# Patient Record
Sex: Female | Born: 1995
Health system: Southern US, Community
[De-identification: ages and names within clinical notes are randomized; demographics above are authoritative.]

## PROBLEM LIST (undated history)

## (undated) DIAGNOSIS — N920 Excessive and frequent menstruation with regular cycle: Secondary | ICD-10-CM

## (undated) DIAGNOSIS — N946 Dysmenorrhea, unspecified: Secondary | ICD-10-CM

## (undated) DIAGNOSIS — R1110 Vomiting, unspecified: Secondary | ICD-10-CM

## (undated) DIAGNOSIS — Z8614 Personal history of Methicillin resistant Staphylococcus aureus infection: Secondary | ICD-10-CM

## (undated) DIAGNOSIS — R110 Nausea: Secondary | ICD-10-CM

## (undated) DIAGNOSIS — R109 Unspecified abdominal pain: Secondary | ICD-10-CM

## (undated) DIAGNOSIS — R51 Headache: Secondary | ICD-10-CM

## (undated) DIAGNOSIS — F419 Anxiety disorder, unspecified: Secondary | ICD-10-CM

## (undated) HISTORY — DX: Vomiting, unspecified: R11.10

## (undated) HISTORY — DX: Unspecified abdominal pain: R10.9

## (undated) HISTORY — DX: Dysmenorrhea, unspecified: N94.6

## (undated) HISTORY — DX: Nausea: R11.0

## (undated) HISTORY — DX: Excessive and frequent menstruation with regular cycle: N92.0

---
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
## 2015-05-08 DIAGNOSIS — Z3202 Encounter for pregnancy test, result negative: Secondary | ICD-10-CM | POA: Diagnosis not present

## 2015-05-08 DIAGNOSIS — Z79899 Other long term (current) drug therapy: Secondary | ICD-10-CM | POA: Diagnosis not present

## 2015-05-08 DIAGNOSIS — R102 Pelvic and perineal pain: Secondary | ICD-10-CM | POA: Insufficient documentation

## 2015-05-08 DIAGNOSIS — R112 Nausea with vomiting, unspecified: Secondary | ICD-10-CM | POA: Insufficient documentation

## 2015-05-08 LAB — CBC WITH DIFFERENTIAL/PLATELET
BASOS ABS: 0 10*3/uL (ref 0–0.1)
Basophils Relative: 0 %
EOS ABS: 0.1 10*3/uL (ref 0–0.7)
Eosinophils Relative: 1 %
HCT: 45.2 % (ref 35.0–47.0)
HEMOGLOBIN: 15.4 g/dL (ref 12.0–16.0)
LYMPHS ABS: 2.9 10*3/uL (ref 1.0–3.6)
Lymphocytes Relative: 22 %
MCH: 30.1 pg (ref 26.0–34.0)
MCHC: 34 g/dL (ref 32.0–36.0)
MCV: 88.6 fL (ref 80.0–100.0)
Monocytes Absolute: 0.9 10*3/uL (ref 0.2–0.9)
Monocytes Relative: 7 %
NEUTROS PCT: 70 %
Neutro Abs: 9.1 10*3/uL — ABNORMAL HIGH (ref 1.4–6.5)
Platelets: 219 10*3/uL (ref 150–440)
RBC: 5.1 MIL/uL (ref 3.80–5.20)
RDW: 12.3 % (ref 11.5–14.5)
WBC: 13.1 10*3/uL — AB (ref 3.6–11.0)

## 2015-05-08 LAB — COMPREHENSIVE METABOLIC PANEL
ALBUMIN: 4.6 g/dL (ref 3.5–5.0)
ALK PHOS: 89 U/L (ref 38–126)
ALT: 19 U/L (ref 14–54)
AST: 21 U/L (ref 15–41)
Anion gap: 10 (ref 5–15)
BUN: 20 mg/dL (ref 6–20)
CALCIUM: 9.6 mg/dL (ref 8.9–10.3)
CO2: 24 mmol/L (ref 22–32)
CREATININE: 0.77 mg/dL (ref 0.44–1.00)
Chloride: 103 mmol/L (ref 101–111)
GFR calc non Af Amer: >60 mL/min (ref >60)
GLUCOSE: 91 mg/dL (ref 65–99)
Potassium: 3.5 mmol/L (ref 3.5–5.1)
SODIUM: 137 mmol/L (ref 135–145)
Total Bilirubin: 0.6 mg/dL (ref 0.3–1.2)
Total Protein: 8.6 g/dL — ABNORMAL HIGH (ref 6.5–8.1)

## 2015-05-08 NOTE — ED Notes (Signed)
Pt in with co lower abd pain and nausea.  Vomited x 1 today, no diarrhea.

## 2015-05-09 ENCOUNTER — Emergency Department
Admission: EM | Admit: 2015-05-09 | Discharge: 2015-05-09 | Disposition: A | Discharge status: Home / Self Care | Payer: BLUE CROSS/BLUE SHIELD | Attending: Emergency Medicine | Admitting: Emergency Medicine

## 2015-05-09 DIAGNOSIS — R110 Nausea: Secondary | ICD-10-CM

## 2015-05-09 LAB — URINALYSIS COMPLETE WITH MICROSCOPIC (ARMC ONLY)
BACTERIA UA: NONE SEEN
Bilirubin Urine: NEGATIVE
GLUCOSE, UA: NEGATIVE mg/dL
HGB URINE DIPSTICK: NEGATIVE
Ketones, ur: NEGATIVE mg/dL
LEUKOCYTES UA: NEGATIVE
Nitrite: NEGATIVE
PH: 6 (ref 5.0–8.0)
PROTEIN: NEGATIVE mg/dL
RBC / HPF: NONE SEEN RBC/hpf (ref 0–5)
Specific Gravity, Urine: 1.021 (ref 1.005–1.030)

## 2015-05-09 LAB — PREGNANCY, URINE: Preg Test, Ur: NEGATIVE

## 2015-05-09 LAB — WET PREP, GENITAL: Trich, Wet Prep: NEGATIVE — AB

## 2015-05-09 LAB — CHLAMYDIA/NGC RT PCR (ARMC ONLY)
Chlamydia Tr: NOT DETECTED
N GONORRHOEAE: NOT DETECTED

## 2015-05-09 LAB — HCG, QUANTITATIVE, PREGNANCY

## 2015-05-09 NOTE — ED Notes (Signed)
MD at bedside. 

## 2015-05-09 NOTE — Discharge Instructions (Signed)
Nausea, Adult °Nausea is the feeling that you have an upset stomach or have to vomit. Nausea by itself is not likely a serious concern, but it may be an early sign of more serious medical problems. As nausea gets worse, it can lead to vomiting. If vomiting develops, there is the risk of dehydration.  °CAUSES  °· Viral infections. °· Food poisoning. °· Medicines. °· Pregnancy. °· Motion sickness. °· Migraine headaches. °· Emotional distress. °· Severe pain from any source. °· Alcohol intoxication. °HOME CARE INSTRUCTIONS °· Get plenty of rest. °· Ask your caregiver about specific rehydration instructions. °· Eat small amounts of food and sip liquids more often. °· Take all medicines as told by your caregiver. °SEEK MEDICAL CARE IF: °· You have not improved after 2 days, or you get worse. °· You have a headache. °SEEK IMMEDIATE MEDICAL CARE IF:  °· You have a fever. °· You faint. °· You keep vomiting or have blood in your vomit. °· You are extremely weak or dehydrated. °· You have dark or bloody stools. °· You have severe chest or abdominal pain. °MAKE SURE YOU: °· Understand these instructions. °· Will watch your condition. °· Will get help right away if you are not doing well or get worse. °  °This information is not intended to replace advice given to you by your health care provider. Make sure you discuss any questions you have with your health care provider. °  °Document Released: 07/01/2004 Document Revised: 06/14/2014 Document Reviewed: 02/03/2011 °Elsevier Interactive Patient Education ©2016 Elsevier Inc. ° °

## 2015-05-09 NOTE — ED Provider Notes (Signed)
[facility] Emergency Department Provider Note  ____________________________________________  Time seen: 1:00 AM  I have reviewed the triage vital signs and the nursing notes.   HISTORY  Chief Complaint Nausea      HPI Kathy Pham is a 19 y.o. female presents with lower pelvic pain and nausea times one week with one episode of nonbloody emesis today. Patient denies any fever no diarrhea. Last menstrual period October.   Past Medical History  Diagnosis Date  . Abdominal pain, recurrent   . Vomiting   . Nausea   . OZHYQMVH(846.9)     Patient Active Problem List   Diagnosis Date Noted  . Episodic tension type headache 04/06/2013  . Anxiety state, unspecified 01/25/2013  . Depression 01/24/2013  . Migraine without aura, without mention of intractable migraine without mention of status migrainosus 01/24/2013  . Generalized abdominal pain   . Vomiting   . Nausea     Past Surgical History  Procedure Laterality Date  . Esophagogastroduodenoscopy  08/20/2011    Procedure: ESOPHAGOGASTRODUODENOSCOPY (EGD);  Surgeon: Jon Gills, MD;  Location: [facility] OR;  Service: Gastroenterology;  Laterality: N/A;    Current Outpatient Rx  Name  Route  Sig  Dispense  Refill  . FLUoxetine (PROZAC) 20 MG capsule   Oral   Take 20 mg by mouth daily.         Marland Kitchen ibuprofen (ADVIL,MOTRIN) 200 MG tablet   Oral   Take 400 mg by mouth every 6 (six) hours as needed. For pain         . omeprazole (PRILOSEC) 20 MG capsule   Oral   Take 20 mg by mouth daily.         . ondansetron (ZOFRAN-ODT) 8 MG disintegrating tablet   Oral   Take 8 mg by mouth 3 (three) times daily as needed. For nausea         . promethazine (PHENERGAN) 25 MG tablet   Oral   Take 25 mg by mouth every 6 (six) hours as needed. For nausea         . SUMAtriptan (IMITREX) 50 MG tablet      TAKE ONE TABLET BY MOUTH WITH TWO TABLETS OF A NON-STEROIDAL ANTI-INFLAMMATORY MEDICATION AT THE  ONSET OF A MIGRAINE   6 tablet   5   . verapamil (CALAN) 40 MG tablet      Take 1 tablet at bedtime   30 tablet   3     Allergies Review of patient's allergies indicates no known allergies.  Family History  Problem Relation Age of Onset  . GER disease Father     Social History Social History  Substance Use Topics  . Smoking status: Never Smoker   . Smokeless tobacco: Never Used  . Alcohol Use: No    Review of Systems  Constitutional: Negative for fever. Eyes: Negative for visual changes. ENT: Negative for sore throat. Cardiovascular: Negative for chest pain. Respiratory: Negative for shortness of breath. Gastrointestinal: Negative for abdominal pain, vomiting and diarrhea. Genitourinary: Negative for dysuria. Musculoskeletal: Negative for back pain. Skin: Negative for rash. Neurological: Negative for headaches, focal weakness or numbness.   10-point ROS otherwise negative.  ____________________________________________   PHYSICAL EXAM:  VITAL SIGNS: ED Triage Vitals  Enc Vitals Group     BP 05/08/15 2312 123/79 mmHg     Pulse Rate 05/08/15 2312 112     Resp 05/08/15 2312 18     Temp 05/08/15 2312 98.3 F (36.8  C)     Temp Source 05/08/15 2312 Oral     SpO2 05/08/15 2312 98 %     Weight 05/08/15 2312 135 lb (61.236 kg)     Height 05/08/15 2312 5\' 3"  (1.6 m)     Head Cir --      Peak Flow --      Pain Score --      Pain Loc --      Pain Edu? --      Excl. in GC? --     Constitutional: Alert and oriented. Well appearing and in no distress. Eyes: Conjunctivae are normal. PERRL. Normal extraocular movements. ENT   Head: Normocephalic and atraumatic.   Nose: No congestion/rhinnorhea.   Mouth/Throat: Mucous membranes are moist.   Neck: No stridor. Hematological/Lymphatic/Immunilogical: No cervical lymphadenopathy. Cardiovascular: Normal rate, regular rhythm. Normal and symmetric distal pulses are present in all extremities. No murmurs,  rubs, or gallops. Respiratory: Normal respiratory effort without tachypnea nor retractions. Breath sounds are clear and equal bilaterally. No wheezes/rales/rhonchi. Gastrointestinal: Soft and nontender. No distention. There is no CVA tenderness. Genitourinary: deferred Musculoskeletal: Nontender with normal range of motion in all extremities. No joint effusions.  No lower extremity tenderness nor edema. Neurologic:  Normal speech and language. No gross focal neurologic deficits are appreciated. Speech is normal.  Skin:  Skin is warm, dry and intact. No rash noted. Psychiatric: Mood and affect are normal. Speech and behavior are normal. Patient exhibits appropriate insight and judgment.  ____________________________________________    LABS (pertinent positives/negatives)  Labs Reviewed  WET PREP, GENITAL - Abnormal; Notable for the following:    Yeast Wet Prep HPF POC NONE (*)    Trich, Wet Prep NEGATIVE (*)    Clue Cells Wet Prep HPF POC NONE (*)    WBC, Wet Prep HPF POC FEW (*)    All other components within normal limits  CBC WITH DIFFERENTIAL/PLATELET - Abnormal; Notable for the following:    WBC 13.1 (*)    Neutro Abs 9.1 (*)    All other components within normal limits  COMPREHENSIVE METABOLIC PANEL - Abnormal; Notable for the following:    Total Protein 8.6 (*)    All other components within normal limits  URINALYSIS COMPLETEWITH MICROSCOPIC ([facility] ONLY) - Abnormal; Notable for the following:    Color, Urine YELLOW (*)    APPearance CLEAR (*)    Squamous Epithelial / LPF 0-5 (*)    All other components within normal limits  CHLAMYDIA/NGC RT PCR ([facility] ONLY)  PREGNANCY, URINE  HCG, QUANTITATIVE, PREGNANCY           INITIAL IMPRESSION / ASSESSMENT AND PLAN / ED COURSE  Pertinent labs & imaging results that were available during my care of the patient were reviewed by me and considered in my medical decision making (see chart for details).  Patient states  that she was very concerned about possibly pregnant however hCG Quant less than 1.  ____________________________________________   FINAL CLINICAL IMPRESSION(S) / ED DIAGNOSES  Final diagnoses:  Nausea      Darci Currentandolph N Modesty Rudy, MD 05/09/15 [phone]

## 2016-01-08 ENCOUNTER — Ambulatory Visit (INDEPENDENT_AMBULATORY_CARE_PROVIDER_SITE_OTHER): Payer: BLUE CROSS/BLUE SHIELD | Admitting: Obstetrics and Gynecology

## 2016-01-08 ENCOUNTER — Encounter: Payer: Self-pay | Admitting: Obstetrics and Gynecology

## 2016-01-08 VITALS — BP 119/78 | HR 89 | Ht 62.5 in | Wt 165.0 lb

## 2016-01-08 DIAGNOSIS — N926 Irregular menstruation, unspecified: Secondary | ICD-10-CM

## 2016-01-08 DIAGNOSIS — Z308 Encounter for other contraceptive management: Secondary | ICD-10-CM

## 2016-01-08 MED ORDER — LEVONORGESTREL-ETHINYL ESTRAD 0.1-20 MG-MCG PO TABS: 1.0000 | ORAL_TABLET | Freq: Every day | ORAL | 11 refills | Status: DC

## 2016-01-10 DIAGNOSIS — N926 Irregular menstruation, unspecified: Secondary | ICD-10-CM | POA: Insufficient documentation

## 2016-01-10 NOTE — Progress Notes (Signed)
    GYNECOLOGY PROGRESS NOTE  Subjective:    Patient ID: Kathy Pham, female    DOB: 03/26/1996, 20 y.o.   MRN: [id number]  HPI  Patient is a 20 y.o. G0P0 female who presents for complaints of irregular menses.  Was previously on combined OCPs which helped to regulate cycles, however patient notes that she lost her prescription and was unable to get medication filled.  Reports last cycle was heavy, however prior to this had not had a cycle in approximately 2-3 months.  The following portions of the patient's history were reviewed and updated as appropriate: allergies, current medications, past family history, past medical history, past social history, past surgical history and problem list.  Review of Systems Pertinent items noted in HPI and remainder of comprehensive ROS otherwise negative.   Objective:   Blood pressure 119/78, pulse 89, height 5' 2.5" (1.588 m), weight 165 lb (74.8 kg), last menstrual period 01/03/2016. General appearance: alert and no distress Exam deferred  Assessment:   Irregular menses Contraception management  Plan:   Refilled OCP prescription. Patient to begin new OCPs on Sunday. To follow up as needed.  Hildred Laser, MD [facility]

## 2017-10-26 ENCOUNTER — Ambulatory Visit (INDEPENDENT_AMBULATORY_CARE_PROVIDER_SITE_OTHER): Payer: BLUE CROSS/BLUE SHIELD | Admitting: Obstetrics and Gynecology

## 2017-10-26 ENCOUNTER — Telehealth: Payer: Self-pay | Admitting: Obstetrics and Gynecology

## 2017-10-26 ENCOUNTER — Encounter: Payer: Self-pay | Admitting: Obstetrics and Gynecology

## 2017-10-26 ENCOUNTER — Ambulatory Visit (INDEPENDENT_AMBULATORY_CARE_PROVIDER_SITE_OTHER): Payer: BLUE CROSS/BLUE SHIELD

## 2017-10-26 VITALS — BP 110/71 | HR 83 | Ht 62.5 in | Wt 174.8 lb

## 2017-10-26 DIAGNOSIS — Z3687 Encounter for antenatal screening for uncertain dates: Secondary | ICD-10-CM

## 2017-10-26 DIAGNOSIS — O039 Complete or unspecified spontaneous abortion without complication: Secondary | ICD-10-CM

## 2017-10-26 DIAGNOSIS — N912 Amenorrhea, unspecified: Secondary | ICD-10-CM

## 2017-10-26 LAB — POCT URINE PREGNANCY: Preg Test, Ur: POSITIVE — AB

## 2017-10-26 MED ORDER — DOXYLAMINE-PYRIDOXINE 10-10 MG PO TBEC: 10.0000 mg | DELAYED_RELEASE_TABLET | Freq: Every day | ORAL | 1 refills | Status: DC

## 2017-10-26 NOTE — Addendum Note (Signed)
Addended by: Elonda Husky on: 10/26/2017 04:18 PM   Modules accepted: Level of Service

## 2017-10-26 NOTE — Progress Notes (Addendum)
HPI:      [name] is a 22 y.o. G1P0000 who LMP was Patient's last menstrual period was 09/15/2017 (approximate).  Subjective:   She presents today after missing her menstrual period and having a positive pregnancy test.  She was not attempting pregnancy and has some misgivings about being pregnant. She has experienced some nausea and vomiting. She reports that she has had a small amount of spotting over 3 days. She is not taking prenatal vitamins    Hx: The following portions of the patient's history were reviewed and updated as appropriate:             She  has a past medical history of Abdominal pain, recurrent, Dysmenorrhea, Headache(784.0), Menorrhagia, Nausea, and Vomiting. She does not have any pertinent problems on file. She  has a past surgical history that includes Esophagogastroduodenoscopy (08/20/2011). Her family history includes Diabetes in her maternal grandfather and paternal grandmother; GER disease in her father. She  reports that she has been smoking cigarettes.  She has been smoking about 0.25 packs per day. She has never used smokeless tobacco. She reports that she does not drink alcohol or use drugs. She has a current medication list which includes the following prescription(s): doxylamine-pyridoxine. She has No Known Allergies.       Review of Systems:  Review of Systems  Constitutional: Denied constitutional symptoms, night sweats, recent illness, fatigue, fever, insomnia and weight loss.  Eyes: Denied eye symptoms, eye pain, photophobia, vision change and visual disturbance.  Ears/Nose/Throat/Neck: Denied ear, nose, throat or neck symptoms, hearing loss, nasal discharge, sinus congestion and sore throat.  Cardiovascular: Denied cardiovascular symptoms, arrhythmia, chest pain/pressure, edema, exercise intolerance, orthopnea and palpitations.  Respiratory: Denied pulmonary symptoms, asthma, pleuritic pain, productive sputum, cough, dyspnea and wheezing.   Gastrointestinal: Denied, gastro-esophageal reflux, melena, nausea and vomiting.  Genitourinary: Denied genitourinary symptoms including symptomatic vaginal discharge, pelvic relaxation issues, and urinary complaints.  Musculoskeletal: Denied musculoskeletal symptoms, stiffness, swelling, muscle weakness and myalgia.  Dermatologic: Denied dermatology symptoms, rash and scar.  Neurologic: Denied neurology symptoms, dizziness, headache, neck pain and syncope.  Psychiatric: Denied psychiatric symptoms, anxiety and depression.  Endocrine: Denied endocrine symptoms including hot flashes and night sweats.   Meds:   No current outpatient medications on file prior to visit.   No current facility-administered medications on file prior to visit.     Objective:     Vitals:   10/26/17 0819  BP: 110/71  Pulse: 83                Assessment:    G1P0000 Patient Active Problem List   Diagnosis Date Noted  . Irregular menses 01/10/2016  . Episodic tension type headache 04/06/2013  . Anxiety state, unspecified 01/25/2013  . Depression 01/24/2013  . Migraine without aura, without mention of intractable migraine without mention of status migrainosus 01/24/2013  . Generalized abdominal pain   . Vomiting   . Nausea      1. Amenorrhea   2. Unsure of LMP (last menstrual period) as reason for ultrasound scan     Positive pregnancy test   Plan:            1.  Ultrasound to determine dating and to investigate spotting.  2.  Diclegis for nausea and vomiting   Discussed dietary modification in detail other strategies for nausea and vomiting discussed  3.  Nurse visit  4.  New OB visit in 6 weeks.   5.  Discussed genetic testing  including turning 21 and quad screen.  Genetic testing and testing for other inheritable conditions discussed in detail. She will decide in the future whether to have these labs performed.    6.  Spotting in early pregnancy discussed     7.  A general overview of  pregnancy testing, visit schedule, ultrasound schedule, and prenatal care was discussed.  Orders Orders Placed This Encounter  Procedures  . US OB Comp Less 14 Wks  . US OB Transvaginal  . POCT urine pregnancy     Meds ordered this encounter  Medications  . Doxylamine-Pyridoxine 10-10 MG TBEC    Sig: Take 10 mg by mouth daily. 2 at bedtime, 1 mid morning, 1 mid afternoon- no more than 4 qd    Dispense:  60 tablet    Refill:  1      F/U  No follow-ups on file. I spent 27 minutes involved in the care of this patient of which greater than 50% was spent discussing nausea and vomiting of pregnancy, spotting in pregnancy, general overview of pregnancy testing visit schedules ultrasounds, genetic testing, use of prenatal vitamins, general pregnancy questions.  Elonda Husky, M.D. 10/26/2017 8:48 AM        Blood type given as B+. Addendum: Patient called complaining of significantly worsened bleeding and cramping and an ultrasound was ordered. At the time of ultrasound Significant amount of vaginal bleeding was found with nothing found in the uterus or adnexa consistent with pregnancy.  Assessment: Likely SAB Plan: SAb I have discussed the possibility of miscarriage with the patient.  I have informed her that vaginal bleeding, cramping or passage of tissue are the most common signs of miscarriage.  Should she have heavy bleeding, pass tissue or have other problems, she has been informed to call the office immediately.  I have advised her to remain at pelvic rest for at least one week after her last episode of bleeding.  If she is currently working, I have instructed her to discontinue until this situation resolves.  Complete versus incomplete miscarriage was discussed and the patient is aware that should she develop heavy bleeding, fever, or persistent crampy pelvic pain she may require a D & E to remove the remaining portion of the miscarried pregnancy.  I advised her to keep me informed  should her condition change. I have discussed the possible use of Cytotec and she has declined this at this time. Advised follow-up in 1 week or sooner if symptoms do not resolve.  Plan quantitative beta hCG next week and then weekly until 0.  Patient would be interested in birth control at that time.  I spent 18 minutes involved in the care of this patient of which greater than 50% was spent discussing spontaneous miscarriage, management of miscarriage, future testing including beta hCGs, future birth control.

## 2017-11-02 ENCOUNTER — Encounter: Payer: Self-pay | Admitting: Obstetrics and Gynecology

## 2017-11-02 ENCOUNTER — Ambulatory Visit (INDEPENDENT_AMBULATORY_CARE_PROVIDER_SITE_OTHER): Payer: BLUE CROSS/BLUE SHIELD | Admitting: Obstetrics and Gynecology

## 2017-11-02 VITALS — BP 110/67 | HR 87 | Ht 63.0 in | Wt 175.3 lb

## 2017-11-02 DIAGNOSIS — O039 Complete or unspecified spontaneous abortion without complication: Secondary | ICD-10-CM

## 2017-11-02 DIAGNOSIS — Z3009 Encounter for other general counseling and advice on contraception: Secondary | ICD-10-CM | POA: Diagnosis not present

## 2017-11-02 NOTE — Progress Notes (Signed)
Pt is present today following a miscarriage. No other concerns.

## 2017-11-02 NOTE — Progress Notes (Signed)
HPI:      [name] is a 22 y.o. G1P0000 who LMP was Patient's last menstrual period was 09/15/2017 (approximate).  Subjective:   She presents today after having a miscarriage.  She reports her bleeding has resolved after significant cramping and passing blood clots.  She is interested in birth control when her beta-hCG goes to 0.  She would like to discuss birth control today.    Hx: The following portions of the patient's history were reviewed and updated as appropriate:             She  has a past medical history of Abdominal pain, recurrent, Dysmenorrhea, Headache(784.0), Menorrhagia, Nausea, and Vomiting. She does not have any pertinent problems on file. She  has a past surgical history that includes Esophagogastroduodenoscopy (08/20/2011). Her family history includes Diabetes in her maternal grandfather and paternal grandmother; GER disease in her father. She  reports that she has been smoking cigarettes.  She has been smoking about 0.25 packs per day. She has never used smokeless tobacco. She reports that she does not drink alcohol or use drugs. She currently has no medications in their medication list. She has No Known Allergies.       Review of Systems:  Review of Systems  Constitutional: Denied constitutional symptoms, night sweats, recent illness, fatigue, fever, insomnia and weight loss.  Eyes: Denied eye symptoms, eye pain, photophobia, vision change and visual disturbance.  Ears/Nose/Throat/Neck: Denied ear, nose, throat or neck symptoms, hearing loss, nasal discharge, sinus congestion and sore throat.  Cardiovascular: Denied cardiovascular symptoms, arrhythmia, chest pain/pressure, edema, exercise intolerance, orthopnea and palpitations.  Respiratory: Denied pulmonary symptoms, asthma, pleuritic pain, productive sputum, cough, dyspnea and wheezing.  Gastrointestinal: Denied, gastro-esophageal reflux, melena, nausea and vomiting.  Genitourinary: Denied genitourinary  symptoms including symptomatic vaginal discharge, pelvic relaxation issues, and urinary complaints.  Musculoskeletal: Denied musculoskeletal symptoms, stiffness, swelling, muscle weakness and myalgia.  Dermatologic: Denied dermatology symptoms, rash and scar.  Neurologic: Denied neurology symptoms, dizziness, headache, neck pain and syncope.  Psychiatric: Denied psychiatric symptoms, anxiety and depression.  Endocrine: Denied endocrine symptoms including hot flashes and night sweats.   Meds:   No current outpatient medications on file prior to visit.   No current facility-administered medications on file prior to visit.     Objective:     Vitals:   11/02/17 1052  BP: 110/67  Pulse: 87                Assessment:    G1P0000 Patient Active Problem List   Diagnosis Date Noted  . Irregular menses 01/10/2016  . Episodic tension type headache 04/06/2013  . Anxiety state, unspecified 01/25/2013  . Depression 01/24/2013  . Migraine without aura, without mention of intractable migraine without mention of status migrainosus 01/24/2013  . Generalized abdominal pain   . Vomiting   . Nausea      1. SAB (spontaneous abortion)   2. Birth control counseling     Discussed SAb in detail.  Questions answered.  Future pregnancy discussed.   Plan:            1.  Plan to follow hCG until 0.  2.  Birth Control I discussed multiple birth control options and methods with the patient.  The risks and benefits of each were reviewed. IUD Literature on Mirena given.  Risks and benefits discussed.  She is considering IUD as an option for birth/cycle control. She will inform [place] when she is reached a decision regarding  birth control and after her hCG is 0 we will follow-up for this. Orders Orders Placed This Encounter  Procedures  . Beta hCG quant (ref lab)  . Beta hCG quant (ref lab)    No orders of the defined types were placed in this encounter.     F/U  Return in about 1 month  (around 11/30/2017). I spent 17 minutes involved in the care of this patient of which greater than 50% was spent discussing miscarriage, risks for future pregnancies, timing of next pregnancy, multiple birth control methods.  Elonda Husky, M.D. 11/02/2017 11:32 AM

## 2017-11-03 LAB — BETA HCG QUANT (REF LAB): HCG QUANT: 3 m[IU]/mL

## 2017-11-09 ENCOUNTER — Other Ambulatory Visit: Payer: BLUE CROSS/BLUE SHIELD

## 2017-11-17 NOTE — Telephone Encounter (Signed)
error 

## 2017-11-30 ENCOUNTER — Encounter: Payer: BLUE CROSS/BLUE SHIELD | Admitting: Obstetrics and Gynecology

## 2017-12-06 ENCOUNTER — Ambulatory Visit (INDEPENDENT_AMBULATORY_CARE_PROVIDER_SITE_OTHER): Payer: BLUE CROSS/BLUE SHIELD | Admitting: Obstetrics and Gynecology

## 2017-12-06 ENCOUNTER — Encounter: Payer: Self-pay | Admitting: Obstetrics and Gynecology

## 2017-12-06 VITALS — BP 120/72 | HR 96 | Ht 63.0 in | Wt 174.1 lb

## 2017-12-06 DIAGNOSIS — Z30430 Encounter for insertion of intrauterine contraceptive device: Secondary | ICD-10-CM | POA: Diagnosis not present

## 2017-12-06 NOTE — Progress Notes (Signed)
Pt is present today for iud insertion. Pt stated that she is doing well.

## 2017-12-06 NOTE — Progress Notes (Signed)
HPI:      [name] is a 22 y.o. G1P0000 who LMP was Patient's last menstrual period was 12/04/2017.  Subjective:   She presents today for IUD insertion.  She has decided upon IUD for birth control.  She is currently on her menstrual period.    Hx: The following portions of the patient's history were reviewed and updated as appropriate:             She  has a past medical history of Abdominal pain, recurrent, Dysmenorrhea, Headache(784.0), Menorrhagia, Nausea, and Vomiting. She does not have any pertinent problems on file. She  has a past surgical history that includes Esophagogastroduodenoscopy (08/20/2011). Her family history includes Diabetes in her maternal grandfather and paternal grandmother; GER disease in her father. She  reports that she has been smoking cigarettes.  She has been smoking about 0.25 packs per day. She has never used smokeless tobacco. She reports that she does not drink alcohol or use drugs. She currently has no medications in their medication list. She has No Known Allergies.       Review of Systems:  Review of Systems  Constitutional: Denied constitutional symptoms, night sweats, recent illness, fatigue, fever, insomnia and weight loss.  Eyes: Denied eye symptoms, eye pain, photophobia, vision change and visual disturbance.  Ears/Nose/Throat/Neck: Denied ear, nose, throat or neck symptoms, hearing loss, nasal discharge, sinus congestion and sore throat.  Cardiovascular: Denied cardiovascular symptoms, arrhythmia, chest pain/pressure, edema, exercise intolerance, orthopnea and palpitations.  Respiratory: Denied pulmonary symptoms, asthma, pleuritic pain, productive sputum, cough, dyspnea and wheezing.  Gastrointestinal: Denied, gastro-esophageal reflux, melena, nausea and vomiting.  Genitourinary: Denied genitourinary symptoms including symptomatic vaginal discharge, pelvic relaxation issues, and urinary complaints.  Musculoskeletal: Denied  musculoskeletal symptoms, stiffness, swelling, muscle weakness and myalgia.  Dermatologic: Denied dermatology symptoms, rash and scar.  Neurologic: Denied neurology symptoms, dizziness, headache, neck pain and syncope.  Psychiatric: Denied psychiatric symptoms, anxiety and depression.  Endocrine: Denied endocrine symptoms including hot flashes and night sweats.   Meds:   No current outpatient medications on file prior to visit.   No current facility-administered medications on file prior to visit.     Objective:     Vitals:   12/06/17 1425  BP: 120/72  Pulse: 96    Physical examination   Pelvic:   Vulva: Normal appearance.  No lesions.  Vagina: No lesions or abnormalities noted.  Support: Normal pelvic support.  Urethra No masses tenderness or scarring.  Meatus Normal size without lesions or prolapse.  Cervix: Normal appearance.  No lesions.  Anus: Normal exam.  No lesions.  Perineum: Normal exam.  No lesions.        Bimanual   Uterus: Normal size.  Non-tender.  Mobile.  AV.  Adnexae: No masses.  Non-tender to palpation.  Cul-de-sac: Negative for abnormality.   IUD Procedure Pt has read the booklet and signed the appropriate forms regarding the Mirena IUD.  All of her questions have been answered.   The cervix was cleansed with betadine solution.  After sounding the uterus and noting the position, the IUD was placed in the usual manner without problem.  The string was cut to the appropriate length.  The patient tolerated the procedure well.              Assessment:    G1P0000 Patient Active Problem List   Diagnosis Date Noted  . Irregular menses 01/10/2016  . Episodic tension type headache 04/06/2013  . Anxiety state, unspecified  01/25/2013  . Depression 01/24/2013  . Migraine without aura, without mention of intractable migraine without mention of status migrainosus 01/24/2013  . Generalized abdominal pain   . Vomiting   . Nausea      1. Encounter for  insertion of mirena IUD     Patient tolerated Mirena insertion without difficulty.  Plan:             F/U  Return in about 1 month (around 01/03/2018).  Elonda Huskyavid J. Jerian Morais, M.D. 12/06/2017 3:17 PM

## 2017-12-07 ENCOUNTER — Encounter: Payer: BLUE CROSS/BLUE SHIELD | Admitting: Obstetrics and Gynecology

## 2018-01-04 ENCOUNTER — Encounter: Payer: BLUE CROSS/BLUE SHIELD | Admitting: Obstetrics and Gynecology

## 2018-01-11 ENCOUNTER — Ambulatory Visit (INDEPENDENT_AMBULATORY_CARE_PROVIDER_SITE_OTHER): Payer: BLUE CROSS/BLUE SHIELD | Admitting: Obstetrics and Gynecology

## 2018-01-11 ENCOUNTER — Encounter: Payer: Self-pay | Admitting: Obstetrics and Gynecology

## 2018-01-11 VITALS — BP 114/75 | HR 94 | Ht 63.0 in | Wt 176.0 lb

## 2018-01-11 DIAGNOSIS — Z30431 Encounter for routine checking of intrauterine contraceptive device: Secondary | ICD-10-CM | POA: Diagnosis not present

## 2018-01-11 NOTE — Progress Notes (Signed)
HPI:      [name] is a 22 y.o. G1P0000 who LMP was No LMP recorded.  Subjective:   She presents today for IUD check.  She reports her bleeding has stopped.  She states that her boyfriend is complaining that he can feel the strings she would like them checked and shortened if necessary.  Otherwise she likes her IUD for birth control.    Hx: The following portions of the patient's history were reviewed and updated as appropriate:             She  has a past medical history of Abdominal pain, recurrent, Dysmenorrhea, Headache(784.0), Menorrhagia, Nausea, and Vomiting. She does not have any pertinent problems on file. She  has a past surgical history that includes Esophagogastroduodenoscopy (08/20/2011). Her family history includes Diabetes in her maternal grandfather and paternal grandmother; GER disease in her father. She  reports that she has been smoking cigarettes.  She has been smoking about 0.25 packs per day. She has never used smokeless tobacco. She reports that she does not drink alcohol or use drugs. She currently has no medications in their medication list. She has No Known Allergies.       Review of Systems:  Review of Systems  Constitutional: Denied constitutional symptoms, night sweats, recent illness, fatigue, fever, insomnia and weight loss.  Eyes: Denied eye symptoms, eye pain, photophobia, vision change and visual disturbance.  Ears/Nose/Throat/Neck: Denied ear, nose, throat or neck symptoms, hearing loss, nasal discharge, sinus congestion and sore throat.  Cardiovascular: Denied cardiovascular symptoms, arrhythmia, chest pain/pressure, edema, exercise intolerance, orthopnea and palpitations.  Respiratory: Denied pulmonary symptoms, asthma, pleuritic pain, productive sputum, cough, dyspnea and wheezing.  Gastrointestinal: Denied, gastro-esophageal reflux, melena, nausea and vomiting.  Genitourinary: See HPI for additional information.  Musculoskeletal: Denied  musculoskeletal symptoms, stiffness, swelling, muscle weakness and myalgia.  Dermatologic: Denied dermatology symptoms, rash and scar.  Neurologic: Denied neurology symptoms, dizziness, headache, neck pain and syncope.  Psychiatric: Denied psychiatric symptoms, anxiety and depression.  Endocrine: Denied endocrine symptoms including hot flashes and night sweats.   Meds:   No current outpatient medications on file prior to visit.   No current facility-administered medications on file prior to visit.     Objective:     Vitals:   01/11/18 1128  BP: 114/75  Pulse: 94              Physical examination   Pelvic:   Vulva: Normal appearance.  No lesions.  Vagina: No lesions or abnormalities noted.  Support: Normal pelvic support.  Urethra No masses tenderness or scarring.  Meatus Normal size without lesions or prolapse.  Cervix: Normal appearance.  No lesions. IUD strings noted at cervical os and shortened  Anus: Normal exam.  No lesions.  Perineum: Normal exam.  No lesions.        Bimanual   Uterus: Normal size.  Non-tender.  Mobile.  AV.  Adnexae: No masses.  Non-tender to palpation.  Cul-de-sac: Negative for abnormality.     Assessment:    G1P0000 Patient Active Problem List   Diagnosis Date Noted  . Irregular menses 01/10/2016  . Episodic tension type headache 04/06/2013  . Anxiety state, unspecified 01/25/2013  . Depression 01/24/2013  . Migraine without aura, without mention of intractable migraine without mention of status migrainosus 01/24/2013  . Generalized abdominal pain   . Vomiting   . Nausea      1. Surveillance of previously prescribed intrauterine contraceptive device  Patient doing well with IUD.  Expect no further issues now the strings have been shortened.   Plan:            1.  Patient states that she has never had a Pap smear or "annual exam".  She was scheduled this before the end of the year. Orders No orders of the defined types were  placed in this encounter.   No orders of the defined types were placed in this encounter.     F/U  Return for Annual Physical. I spent 12 minutes involved in the care of this patient of which greater than 50% was spent discussing her IUD, string length, pelvic bleeding and cramping, necessary follow-up for annual exam and Pap smear.  Elonda Huskyavid J. Amaryah Mallen, M.D. 01/11/2018 11:58 AM

## 2018-01-11 NOTE — Progress Notes (Signed)
Pt states that her boyfriend can feel the string and she would like them to be trimmed.

## 2018-06-22 ENCOUNTER — Encounter: Payer: Self-pay | Admitting: Obstetrics and Gynecology

## 2018-06-22 ENCOUNTER — Ambulatory Visit (INDEPENDENT_AMBULATORY_CARE_PROVIDER_SITE_OTHER): Payer: BLUE CROSS/BLUE SHIELD | Admitting: Obstetrics and Gynecology

## 2018-06-22 ENCOUNTER — Other Ambulatory Visit (HOSPITAL_COMMUNITY)
Admission: RE | Admit: 2018-06-22 | Discharge: 2018-06-22 | Disposition: A | Discharge status: Home / Self Care | Payer: BLUE CROSS/BLUE SHIELD | Source: Ambulatory Visit | Attending: Obstetrics and Gynecology | Admitting: Obstetrics and Gynecology

## 2018-06-22 VITALS — BP 149/86 | HR 81 | Ht 63.25 in | Wt 163.3 lb

## 2018-06-22 DIAGNOSIS — Z124 Encounter for screening for malignant neoplasm of cervix: Secondary | ICD-10-CM | POA: Insufficient documentation

## 2018-06-22 DIAGNOSIS — Z0000 Encounter for general adult medical examination without abnormal findings: Secondary | ICD-10-CM | POA: Diagnosis not present

## 2018-06-22 DIAGNOSIS — R102 Pelvic and perineal pain: Secondary | ICD-10-CM

## 2018-06-22 NOTE — Progress Notes (Signed)
Patient comes in today for her annual exam. She has an IUD that was placed in 12/2017. She is having cramping since having the IUD placed.

## 2018-06-22 NOTE — Progress Notes (Signed)
HPI:      [name] is a 23 y.o. G1P0000 who LMP was No LMP recorded. (Menstrual status: IUD).  Subjective:   She presents today for her annual examination.  Over the last month she states that she has had an increase in pelvic cramping.  Sometimes to the point where she cannot continue her daily activities for short time.  Prior to this she has had no problems with her IUD pelvic cramping or problems with her strings.  She likes the IUD "as long as it is not the cause of her recent cramping".  She reports no changes in bowel or bladder habits. She has never had a Pap smear.    Hx: The following portions of the patient's history were reviewed and updated as appropriate:             She  has a past medical history of Abdominal pain, recurrent, Dysmenorrhea, Headache(784.0), Menorrhagia, Nausea, and Vomiting. She does not have any pertinent problems on file. She  has a past surgical history that includes Esophagogastroduodenoscopy (08/20/2011). Her family history includes Diabetes in her maternal grandfather and paternal grandmother; GER disease in her father. She  reports that she has been smoking cigarettes. She has been smoking about 0.25 packs per day. She has never used smokeless tobacco. She reports that she does not drink alcohol or use drugs. She currently has no medications in their medication list. She has No Known Allergies.       Review of Systems:  Review of Systems  Constitutional: Denied constitutional symptoms, night sweats, recent illness, fatigue, fever, insomnia and weight loss.  Eyes: Denied eye symptoms, eye pain, photophobia, vision change and visual disturbance.  Ears/Nose/Throat/Neck: Denied ear, nose, throat or neck symptoms, hearing loss, nasal discharge, sinus congestion and sore throat.  Cardiovascular: Denied cardiovascular symptoms, arrhythmia, chest pain/pressure, edema, exercise intolerance, orthopnea and palpitations.  Respiratory: Denied pulmonary  symptoms, asthma, pleuritic pain, productive sputum, cough, dyspnea and wheezing.  Gastrointestinal: Denied, gastro-esophageal reflux, melena, nausea and vomiting.  Genitourinary: See HPI for additional information.  Musculoskeletal: Denied musculoskeletal symptoms, stiffness, swelling, muscle weakness and myalgia.  Dermatologic: Denied dermatology symptoms, rash and scar.  Neurologic: Denied neurology symptoms, dizziness, headache, neck pain and syncope.  Psychiatric: Denied psychiatric symptoms, anxiety and depression.  Endocrine: Denied endocrine symptoms including hot flashes and night sweats.   Meds:   No current outpatient medications on file prior to visit.   No current facility-administered medications on file prior to visit.     Objective:     Vitals:   06/22/18 1036  BP: (!) 149/86  Pulse: 81              Physical examination General NAD, Conversant  HEENT Atraumatic; Op clear with mmm.  Normo-cephalic. Pupils reactive. Anicteric sclerae  Thyroid/Neck Smooth without nodularity or enlargement. Normal ROM.  Neck Supple.  Skin No rashes, lesions or ulceration. Normal palpated skin turgor. No nodularity.  Breasts: No masses or discharge.  Symmetric.  No axillary adenopathy.  Lungs: Clear to auscultation.No rales or wheezes. Normal Respiratory effort, no retractions.  Heart: NSR.  No murmurs or rubs appreciated. No periferal edema  Abdomen: Soft.  Non-tender.  No masses.  No HSM. No hernia  Extremities: Moves all appropriately.  Normal ROM for age. No lymphadenopathy.  Neuro: Oriented to PPT.  Normal mood. Normal affect.     Pelvic:   Vulva: Normal appearance.  No lesions.  Vagina: No lesions or abnormalities noted.  Support:  Normal pelvic support.  Urethra No masses tenderness or scarring.  Meatus Normal size without lesions or prolapse.  Cervix: Normal appearance.  No lesions.  IUD string noted-appropriate length at cervical loss  Anus: Normal exam.  No lesions.   Perineum: Normal exam.  No lesions.        Bimanual   Uterus: Normal size.  Non-tender.  Mobile.  AV.  Adnexae: No masses.  Non-tender to palpation.  Cul-de-sac: Negative for abnormality.      Assessment:    G1P0000 Patient Active Problem List   Diagnosis Date Noted  . Irregular menses 01/10/2016  . Episodic tension type headache 04/06/2013  . Anxiety state, unspecified 01/25/2013  . Depression 01/24/2013  . Migraine without aura, without mention of intractable migraine without mention of status migrainosus 01/24/2013  . Generalized abdominal pain   . Vomiting   . Nausea      1. Encounter for annual physical exam   2. Encounter for screening for cervical cancer    3. Pelvic pain in female     Recent pelvic cramping and pelvic pain.   Plan:            1.  Basic Screening Recommendations The basic screening recommendations for asymptomatic women were discussed with the patient during her visit.  The age-appropriate recommendations were discussed with her and the rational for the tests reviewed.  When I am informed by the patient that another primary care physician has previously obtained the age-appropriate tests and they are up-to-date, only outstanding tests are ordered and referrals given as necessary.  Abnormal results of tests will be discussed with her when all of her results are completed. Pap performed-GC/CT 2.  Ultrasound for pelvic pain IUD check ovarian cyst check. Orders Orders Placed This Encounter  Procedures  . US PELVIS (TRANSABDOMINAL ONLY)    No orders of the defined types were placed in this encounter.       F/U  Return in about 1 year (around 06/23/2019) for Annual Physical, We will contact her with any abnormal test results.  Elonda Husky, M.D. 06/22/2018 10:58 AM

## 2018-06-22 NOTE — Addendum Note (Signed)
Addended by: Dorian Pod on: 06/22/2018 11:04 AM   Modules accepted: Orders

## 2018-06-27 LAB — CYTOLOGY - PAP
Candida vaginitis: NEGATIVE
Chlamydia: NEGATIVE
Diagnosis: NEGATIVE
Neisseria Gonorrhea: NEGATIVE
TRICH (WINDOWPATH): NEGATIVE

## 2018-06-29 ENCOUNTER — Ambulatory Visit (INDEPENDENT_AMBULATORY_CARE_PROVIDER_SITE_OTHER): Payer: BLUE CROSS/BLUE SHIELD

## 2018-06-29 ENCOUNTER — Encounter: Payer: BLUE CROSS/BLUE SHIELD | Admitting: Obstetrics and Gynecology

## 2018-06-29 DIAGNOSIS — R102 Pelvic and perineal pain: Secondary | ICD-10-CM

## 2019-04-07 ENCOUNTER — Encounter (HOSPITAL_COMMUNITY): Payer: Self-pay | Admitting: Emergency Medicine

## 2019-04-07 ENCOUNTER — Emergency Department (HOSPITAL_COMMUNITY)
Admission: EM | Admit: 2019-04-07 | Discharge: 2019-04-08 | Disposition: A | Discharge status: Home / Self Care | Payer: BC Managed Care – PPO | Attending: Emergency Medicine | Admitting: Emergency Medicine

## 2019-04-07 ENCOUNTER — Other Ambulatory Visit: Payer: Self-pay

## 2019-04-07 DIAGNOSIS — Z5321 Procedure and treatment not carried out due to patient leaving prior to being seen by health care provider: Secondary | ICD-10-CM | POA: Insufficient documentation

## 2019-04-07 DIAGNOSIS — M542 Cervicalgia: Secondary | ICD-10-CM | POA: Insufficient documentation
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
## 2019-04-08 NOTE — ED Notes (Signed)
Advised patient to stay, decided to leave any way.

## 2019-05-26 DIAGNOSIS — J069 Acute upper respiratory infection, unspecified: Secondary | ICD-10-CM | POA: Diagnosis not present

## 2019-05-26 DIAGNOSIS — Z20828 Contact with and (suspected) exposure to other viral communicable diseases: Secondary | ICD-10-CM | POA: Diagnosis not present

## 2019-06-26 ENCOUNTER — Encounter: Payer: BLUE CROSS/BLUE SHIELD | Admitting: Obstetrics and Gynecology

## 2019-07-10 ENCOUNTER — Other Ambulatory Visit: Payer: Self-pay

## 2019-07-10 ENCOUNTER — Encounter: Payer: Self-pay | Admitting: Obstetrics and Gynecology

## 2019-07-10 ENCOUNTER — Ambulatory Visit (INDEPENDENT_AMBULATORY_CARE_PROVIDER_SITE_OTHER): Payer: BC Managed Care – PPO | Admitting: Obstetrics and Gynecology

## 2019-07-10 VITALS — BP 148/83 | HR 88 | Ht 63.0 in | Wt 153.0 lb

## 2019-07-10 DIAGNOSIS — Z30432 Encounter for removal of intrauterine contraceptive device: Secondary | ICD-10-CM

## 2019-07-10 DIAGNOSIS — Z3009 Encounter for other general counseling and advice on contraception: Secondary | ICD-10-CM | POA: Diagnosis not present

## 2019-07-10 DIAGNOSIS — Z01419 Encounter for gynecological examination (general) (routine) without abnormal findings: Secondary | ICD-10-CM

## 2019-07-10 NOTE — Progress Notes (Signed)
HPI:      [name] is a 24 y.o. G1P0000 who LMP was No LMP recorded. (Menstrual status: IUD).  Subjective:   She presents today for her annual examination.  She has no specific complaints.  She states that she would like her IUD removed in the near future.  She does have occasional cramping randomly with her IUD.  She would like to start OCPs to have more control because she is considering pregnancy in the near future.  Reports that she has successfully taken OCPs in the past.    Hx: The following portions of the patient's history were reviewed and updated as appropriate:             She  has a past medical history of Abdominal pain, recurrent, Dysmenorrhea, Headache(784.0), Menorrhagia, Nausea, and Vomiting. She does not have any pertinent problems on file. She  has a past surgical history that includes Esophagogastroduodenoscopy (08/20/2011). Her family history includes Diabetes in her maternal grandfather and paternal grandmother; GER disease in her father. She  reports that she quit smoking about 3 months ago. Her smoking use included cigarettes. She smoked 0.25 packs per day. She has never used smokeless tobacco. She reports that she does not drink alcohol or use drugs. She currently has no medications in their medication list. She has No Known Allergies.       Review of Systems:  Review of Systems  Constitutional: Denied constitutional symptoms, night sweats, recent illness, fatigue, fever, insomnia and weight loss.  Eyes: Denied eye symptoms, eye pain, photophobia, vision change and visual disturbance.  Ears/Nose/Throat/Neck: Denied ear, nose, throat or neck symptoms, hearing loss, nasal discharge, sinus congestion and sore throat.  Cardiovascular: Denied cardiovascular symptoms, arrhythmia, chest pain/pressure, edema, exercise intolerance, orthopnea and palpitations.  Respiratory: Denied pulmonary symptoms, asthma, pleuritic pain, productive sputum, cough, dyspnea and  wheezing.  Gastrointestinal: Denied, gastro-esophageal reflux, melena, nausea and vomiting.  Genitourinary: Denied genitourinary symptoms including symptomatic vaginal discharge, pelvic relaxation issues, and urinary complaints.  Musculoskeletal: Denied musculoskeletal symptoms, stiffness, swelling, muscle weakness and myalgia.  Dermatologic: Denied dermatology symptoms, rash and scar.  Neurologic: Denied neurology symptoms, dizziness, headache, neck pain and syncope.  Psychiatric: Denied psychiatric symptoms, anxiety and depression.  Endocrine: Denied endocrine symptoms including hot flashes and night sweats.   Meds:   No current outpatient medications on file prior to visit.   No current facility-administered medications on file prior to visit.    Objective:     Vitals:   07/10/19 0905  BP: (!) 148/83  Pulse: 88              Physical examination General NAD, Conversant  HEENT Atraumatic; Op clear with mmm.  Normo-cephalic. Pupils reactive. Anicteric sclerae  Thyroid/Neck Smooth without nodularity or enlargement. Normal ROM.  Neck Supple.  Skin No rashes, lesions or ulceration. Normal palpated skin turgor. No nodularity.  Breasts: No masses or discharge.  Symmetric.  No axillary adenopathy.  Bilateral nipple piercing  Lungs: Clear to auscultation.No rales or wheezes. Normal Respiratory effort, no retractions.  Heart: NSR.  No murmurs or rubs appreciated. No periferal edema  Abdomen: Soft.  Non-tender.  No masses.  No HSM. No hernia  Extremities: Moves all appropriately.  Normal ROM for age. No lymphadenopathy.  Neuro: Oriented to PPT.  Normal mood. Normal affect.     Pelvic:   Vulva: Normal appearance.  No lesions.  Vagina: No lesions or abnormalities noted.  Support: Normal pelvic support.  Urethra No masses tenderness  or scarring.  Meatus Normal size without lesions or prolapse.  Cervix: Normal appearance.  No lesions.  IUD strings not noted  Anus: Normal exam.  No  lesions.  Perineum: Normal exam.  No lesions.        Bimanual   Uterus: Normal size.  Non-tender.  Mobile.  AV.  Adnexae: No masses.  Non-tender to palpation.  Cul-de-sac: Negative for abnormality.      Assessment:    G1P0000 Patient Active Problem List   Diagnosis Date Noted  . Irregular menses 01/10/2016  . Episodic tension type headache 04/06/2013  . Anxiety state, unspecified 01/25/2013  . Depression 01/24/2013  . Migraine without aura, without mention of intractable migraine without mention of status migrainosus 01/24/2013  . Generalized abdominal pain   . Vomiting   . Nausea      1. Birth control counseling   2. Well woman exam with routine gynecological exam     Patient considering IUD removal.  She previously had the strings shortened into the cervical os.   Plan:            1.  Basic Screening Recommendations The basic screening recommendations for asymptomatic women were discussed with the patient during her visit.  The age-appropriate recommendations were discussed with her and the rational for the tests reviewed.  When I am informed by the patient that another primary care physician has previously obtained the age-appropriate tests and they are up-to-date, only outstanding tests are ordered and referrals given as necessary.  Abnormal results of tests will be discussed with her when all of her results are completed.  Routine preventative health maintenance measures emphasized: Exercise/Diet/Weight control, Tobacco Warnings, Alcohol/Substance use risks and Stress Management 2.  Patient to schedule IUD removal at her convenience. 3.  Patient considering OCPs for birth control. Orders No orders of the defined types were placed in this encounter.   No orders of the defined types were placed in this encounter.       F/U  Return in about 1 year (around 07/09/2020) for Annual Physical.  Finis Bud, M.D. 07/10/2019 9:32 AM

## 2019-07-10 NOTE — Progress Notes (Signed)
Patient comes in today for annual exam. She would like to have IUD removed and go on [facility] pills. Pap normal 2020.

## 2019-07-31 ENCOUNTER — Ambulatory Visit (INDEPENDENT_AMBULATORY_CARE_PROVIDER_SITE_OTHER): Payer: BC Managed Care – PPO | Admitting: Obstetrics and Gynecology

## 2019-07-31 ENCOUNTER — Encounter: Payer: Self-pay | Admitting: Obstetrics and Gynecology

## 2019-07-31 ENCOUNTER — Other Ambulatory Visit: Payer: Self-pay

## 2019-07-31 VITALS — BP 117/75 | HR 89 | Ht 63.0 in | Wt 159.0 lb

## 2019-07-31 DIAGNOSIS — Z30011 Encounter for initial prescription of contraceptive pills: Secondary | ICD-10-CM

## 2019-07-31 DIAGNOSIS — Z30432 Encounter for removal of intrauterine contraceptive device: Secondary | ICD-10-CM

## 2019-07-31 MED ORDER — DESOGESTREL-ETHINYL ESTRADIOL 0.15-0.02/0.01 MG (21/5) PO TABS: 1.0000 | ORAL_TABLET | Freq: Every day | ORAL | 2 refills | Status: DC

## 2019-07-31 NOTE — Progress Notes (Signed)
Patient comes in today for IUD removal. She would like to go on [facility] pills.

## 2019-07-31 NOTE — Progress Notes (Signed)
HPI:      [name] is a 24 y.o. G1P0000 who LMP was No LMP recorded. (Menstrual status: IUD).  Subjective:   She presents today desiring to have her Mirena IUD removed.  She would like to start OCPs so that she has better cycle control and can decide when she wants to become pregnant. We spent some time reviewing return to menstrual cycles, OCP use, and timing of intercourse.   Hx: The following portions of the patient's history were reviewed and updated as appropriate:             She  has a past medical history of Abdominal pain, recurrent, Dysmenorrhea, Headache(784.0), Menorrhagia, Nausea, and Vomiting. She does not have any pertinent problems on file. She  has a past surgical history that includes Esophagogastroduodenoscopy (08/20/2011). Her family history includes Diabetes in her maternal grandfather and paternal grandmother; GER disease in her father. She  reports that she quit smoking about 3 months ago. Her smoking use included cigarettes. She smoked 0.25 packs per day. She has never used smokeless tobacco. She reports that she does not drink alcohol or use drugs. She has a current medication list which includes the following prescription(s): desogestrel-ethinyl estradiol. She has No Known Allergies.       Review of Systems:  Review of Systems  Constitutional: Denied constitutional symptoms, night sweats, recent illness, fatigue, fever, insomnia and weight loss.  Eyes: Denied eye symptoms, eye pain, photophobia, vision change and visual disturbance.  Ears/Nose/Throat/Neck: Denied ear, nose, throat or neck symptoms, hearing loss, nasal discharge, sinus congestion and sore throat.  Cardiovascular: Denied cardiovascular symptoms, arrhythmia, chest pain/pressure, edema, exercise intolerance, orthopnea and palpitations.  Respiratory: Denied pulmonary symptoms, asthma, pleuritic pain, productive sputum, cough, dyspnea and wheezing.  Gastrointestinal: Denied, gastro-esophageal  reflux, melena, nausea and vomiting.  Genitourinary: Denied genitourinary symptoms including symptomatic vaginal discharge, pelvic relaxation issues, and urinary complaints.  Musculoskeletal: Denied musculoskeletal symptoms, stiffness, swelling, muscle weakness and myalgia.  Dermatologic: Denied dermatology symptoms, rash and scar.  Neurologic: Denied neurology symptoms, dizziness, headache, neck pain and syncope.  Psychiatric: Denied psychiatric symptoms, anxiety and depression.  Endocrine: Denied endocrine symptoms including hot flashes and night sweats.   Meds:   No current outpatient medications on file prior to visit.   No current facility-administered medications on file prior to visit.    Objective:     Vitals:   07/31/19 0924  BP: 117/75  Pulse: 89              Physical examination   Pelvic:  Vulva: Normal appearance.  No lesions.  Vagina: No lesions or abnormalities noted.  Support: Normal pelvic support.  Urethra No masses tenderness or scarring.  Meatus Normal size without lesions or prolapse.  Cervix: Normal appearance.  No lesions.  Anus: Normal exam.  No lesions.  Perineum: Normal exam.  No lesions.   IUD Removal Strings of IUD identified and grasped.  IUD removed without problem.  Pt tolerated this well.  IUD noted to be intact.     Assessment:    G1P0000 Patient Active Problem List   Diagnosis Date Noted  . Irregular menses 01/10/2016  . Episodic tension type headache 04/06/2013  . Anxiety state, unspecified 01/25/2013  . Depression 01/24/2013  . Migraine without aura, without mention of intractable migraine without mention of status migrainosus 01/24/2013  . Generalized abdominal pain   . Vomiting   . Nausea      1. Encounter for IUD removal  2. Initiation of OCP (BCP)        Plan:            1.  OCPs The risks /benefits of OCPs have been explained to the patient in detail.  Product literature has been given to her where appropriate.   I have instructed her in the use of OCPs.  I have explained to the patient that OCPs are not as effective for birth control during the first month of use, and that another form of contraception should be used during this time.  Both first-day start and Sunday start have been explained.  The risks and benefits of each was discussed.  She has been made aware of  the fact that in rare circumstances, other medications may affect the efficacy of OCPs.  I have answered all of her questions, and I believe that she has an understanding of the effectiveness and use of OCPs. She will start OCPs today and be protected in her first pack. Orders No orders of the defined types were placed in this encounter.    Meds ordered this encounter  Medications  . desogestrel-ethinyl estradiol (MIRCETTE) 0.15-0.02/0.01 MG (21/5) tablet    Sig: Take 1 tablet by mouth at bedtime.    Dispense:  1 Package    Refill:  2      F/U  Return for Annual Physical. I spent 12 minutes involved in the care of this patient preparing to see the patient by obtaining and reviewing her medical history (including labs, imaging tests and prior procedures), documenting clinical information in the electronic health record (EHR), counseling and coordinating care plans, writing and sending prescriptions, ordering tests or procedures and directly communicating with the patient by discussing pertinent items from her history and physical exam as well as detailing my assessment and plan as noted above so that she has an informed understanding.  All of her questions were answered.  Finis Bud, M.D. 07/31/2019 10:01 AM

## 2020-01-09 DIAGNOSIS — R300 Dysuria: Secondary | ICD-10-CM | POA: Diagnosis not present

## 2020-01-09 DIAGNOSIS — N390 Urinary tract infection, site not specified: Secondary | ICD-10-CM | POA: Diagnosis not present

## 2020-03-16 DIAGNOSIS — Z20822 Contact with and (suspected) exposure to covid-19: Secondary | ICD-10-CM | POA: Diagnosis not present

## 2020-04-24 DIAGNOSIS — M791 Myalgia, unspecified site: Secondary | ICD-10-CM | POA: Diagnosis not present

## 2020-04-24 DIAGNOSIS — Z20822 Contact with and (suspected) exposure to covid-19: Secondary | ICD-10-CM | POA: Diagnosis not present

## 2020-04-24 DIAGNOSIS — U071 COVID-19: Secondary | ICD-10-CM | POA: Diagnosis not present

## 2020-06-07 NOTE — L&D Delivery Note (Signed)
Delivery Summary for Kathy Pham  Labor Events:   Preterm labor: No data found  Rupture date: 05/10/2021  Rupture time: 10:45 AM  Rupture type: Spontaneous  Fluid Color: Clear Light Meconium  Induction: No data found  Augmentation: No data found  Complications: No data found  Cervical ripening: No data found No data found   No data found     Delivery:   Episiotomy: No data found  Lacerations: No data found  Repair suture: No data found  Repair # of packets: No data found  Blood loss (ml): No data found   Delivery Summary for Kathy Pham  Labor Events:   Preterm labor: No data found  Rupture date: 05/10/2021  Rupture time: 10:45 AM  Rupture type: Spontaneous  Fluid Color: Clear Light Meconium  Induction: No data found  Augmentation: No data found  Complications: No data found  Cervical ripening: No data found No data found   No data found     Delivery:   Episiotomy: No data found  Lacerations: No data found  Repair suture: No data found  Repair # of packets: No data found  Blood loss (ml): 250   Information for the patient's newborn:  Taira, Knabe [[id number]]   Delivery 05/11/2021 2:20 AM by  Vaginal, Vacuum (Extractor) Sex:  female Gestational Age: [redacted]w[redacted]d Delivery Clinician:   Living?:         APGARS  One minute Five minutes Ten minutes  Skin color:        Heart rate:        Grimace:        Muscle tone:        Breathing:        Totals: 7  9      Presentation/position:      Resuscitation:   Cord information:    Disposition of cord blood:     Blood gases sent?  Complications:   Placenta: Delivered:       appearance Newborn Measurements: Weight: 8 lb 8.9 oz (3880 g)  Height: 20.5"  Head circumference:    Chest circumference:    Other providers:    Additional  information: Forceps:   Vacuum:   Breech:   Observed anomalies      Operative Delivery Note At 2:20 AM a viable and healthy female was delivered via Vaginal, Vacuum Investment banker, operational).   Presentation: vertex; Position: Left,, Occiput,, Anterior; Station: +2.  Verbal consent: obtained from patient.  Risks and benefits discussed in detail.  Risks include, but are not limited to the risks of anesthesia, bleeding, infection, damage to maternal tissues, fetal cephalhematoma.  There is also the risk of inability to effect vaginal delivery of the head, or shoulder dystocia that cannot be resolved by established maneuvers, leading to the need for emergency cesarean section.   Indication for operative vaginal delivery: Arrest of descent, non-reassuring fetal tracing  Patient was examined and found to be fully dilated with fetal station of +2.  Patient's bladder was noted to be empty, and there were no known fetal contraindications to operative vaginal delivery. EFW was ~ 8 lbs by [place]/recent ultrasound.  FHR tracing remarkable for variable decelerations and fetal tachycardia.   The soft vacuum cup was positioned over the sagittal suture 3 cm anterior to posterior fontanelle.  Pressure was then increased to 500 mmHg, and the patient was instructed to push.  Pulling was administered along the pelvic curve while patient was pushing; there were 9 contractions and 2 popoffs.  Vacuum was reduced in between contractions.  The infant was then delivered atraumatically, noted to be a viable female infant, 3880 grams, Apgars of 7 and 9.  Nuchal cord x 1, reduced after delivery of fetal body. Cord pH was not obtained. Delayed cord clamping was observed for 1 minute. Neonatology present for delivery.  There was spontaneous placental delivery, intact with three-vessel cord.  First degree perineal laceration noted requiring repair with 3-0 Vicryl in the usual fashion. EBL 250 ml, epidural anesthesia.   Sponge, instrument and needle counts were correct x 2.  The patient and baby were stable after delivery and remained in couplet care, with plans to transfer later to postpartum unit.   Mom to postpartum.  Baby  to Couplet care / Skin to Skin.  Hildred Laser, MD 05/11/2021, 3:00 AM

## 2020-07-22 ENCOUNTER — Encounter: Payer: BC Managed Care – PPO | Admitting: Obstetrics and Gynecology

## 2020-09-10 ENCOUNTER — Other Ambulatory Visit: Payer: Self-pay

## 2020-09-10 ENCOUNTER — Ambulatory Visit (INDEPENDENT_AMBULATORY_CARE_PROVIDER_SITE_OTHER): Payer: BC Managed Care – PPO | Admitting: Obstetrics and Gynecology

## 2020-09-10 ENCOUNTER — Encounter: Payer: Self-pay | Admitting: Obstetrics and Gynecology

## 2020-09-10 VITALS — BP 141/95 | HR 89 | Ht 63.0 in | Wt 173.1 lb

## 2020-09-10 DIAGNOSIS — Z3200 Encounter for pregnancy test, result unknown: Secondary | ICD-10-CM | POA: Diagnosis not present

## 2020-09-10 DIAGNOSIS — N912 Amenorrhea, unspecified: Secondary | ICD-10-CM

## 2020-09-10 LAB — POCT URINE PREGNANCY: Preg Test, Ur: POSITIVE — AB

## 2020-09-10 NOTE — Progress Notes (Signed)
HPI:      [name] is a 25 y.o. G2P0010 who LMP was Patient's last menstrual period was 07/30/2020.  Subjective:   She presents today for pregnancy confirmation.  Patient was attempting pregnancy.  She is taking prenatal vitamins.  She has some nausea but is not vomiting.  Based on last menstrual period she is approximately 6 weeks estimated gestational age.  She does have some concerns because her last pregnancy ended in miscarriage in the first trimester.  She reports that she has some pelvic cramping/back pain but denies any bleeding.    Hx: The following portions of the patient's history were reviewed and updated as appropriate:             She  has a past medical history of Abdominal pain, recurrent, Dysmenorrhea, Headache(784.0), Menorrhagia, Nausea, and Vomiting. She does not have any pertinent problems on file. She  has a past surgical history that includes Esophagogastroduodenoscopy (08/20/2011). Her family history includes Diabetes in her maternal grandfather and paternal grandmother; GER disease in her father. She  reports that she quit smoking about 17 months ago. Her smoking use included cigarettes. She smoked 0.25 packs per day. She has never used smokeless tobacco. She reports that she does not drink alcohol and does not use drugs. She has a current medication list which includes the following prescription(s): multivitamin-prenatal. She has No Known Allergies.       Review of Systems:  Review of Systems  Constitutional: Denied constitutional symptoms, night sweats, recent illness, fatigue, fever, insomnia and weight loss.  Eyes: Denied eye symptoms, eye pain, photophobia, vision change and visual disturbance.  Ears/Nose/Throat/Neck: Denied ear, nose, throat or neck symptoms, hearing loss, nasal discharge, sinus congestion and sore throat.  Cardiovascular: Denied cardiovascular symptoms, arrhythmia, chest pain/pressure, edema, exercise intolerance, orthopnea and  palpitations.  Respiratory: Denied pulmonary symptoms, asthma, pleuritic pain, productive sputum, cough, dyspnea and wheezing.  Gastrointestinal: Denied, gastro-esophageal reflux, melena, nausea and vomiting.  Genitourinary: Denied genitourinary symptoms including symptomatic vaginal discharge, pelvic relaxation issues, and urinary complaints.  Musculoskeletal: Denied musculoskeletal symptoms, stiffness, swelling, muscle weakness and myalgia.  Dermatologic: Denied dermatology symptoms, rash and scar.  Neurologic: Denied neurology symptoms, dizziness, headache, neck pain and syncope.  Psychiatric: Denied psychiatric symptoms, anxiety and depression.  Endocrine: Denied endocrine symptoms including hot flashes and night sweats.   Meds:   Current Outpatient Medications on File Prior to Visit  Medication Sig Dispense Refill  . Prenatal Vit-Fe Fumarate-FA (MULTIVITAMIN-PRENATAL) 27-0.8 MG TABS tablet Take 1 tablet by mouth daily at 12 noon.     No current facility-administered medications on file prior to visit.          Objective:     Vitals:   09/10/20 1512  BP: (!) 141/95  Pulse: 89   Filed Weights   09/10/20 1512  Weight: 173 lb 1.6 oz (78.5 kg)              urinary pregnancy test positive  Assessment:    G2P0010 Patient Active Problem List   Diagnosis Date Noted  . Irregular menses 01/10/2016  . Episodic tension type headache 04/06/2013  . Anxiety state, unspecified 01/25/2013  . Depression 01/24/2013  . Migraine without aura, without mention of intractable migraine without mention of status migrainosus 01/24/2013  . Generalized abdominal pain   . Vomiting   . Nausea      1. Amenorrhea   2. Possible pregnancy, not confirmed     Approximately 6 weeks estimated gestational age.  Plan:          Prenatal Plan 1.  The patient was given prenatal literature. 2.  She was continued on prenatal vitamins. 3.  A prenatal lab panel to be drawn at nurse visit. 4.   An ultrasound was ordered to better determine an EDC. 5.  A nurse visit was scheduled. 6.  Genetic testing and testing for other inheritable conditions discussed in detail. She will decide in the future whether to have these labs performed. 7.  A general overview of pregnancy testing, visit schedule, ultrasound schedule, and prenatal care was discussed. 8.  COVID and its risks associated with pregnancy, prevention by limiting exposure and use of masks, as well as the risks and benefits of vaccination during pregnancy were discussed in detail.  [facility] policy regarding office and hospital visitation and testing was explained. 9.  Benefits of breast-feeding discussed in detail including both maternal and infant benefits. Ready Set Baby website discussed.     Orders Orders Placed This Encounter  Procedures  . US OB Comp Less 14 Wks  . POCT urine pregnancy    No orders of the defined types were placed in this encounter.     F/U  Return in about 6 weeks (around 10/22/2020). I spent 23 minutes involved in the care of this patient preparing to see the patient by obtaining and reviewing her medical history (including labs, imaging tests and prior procedures), documenting clinical information in the electronic health record (EHR), counseling and coordinating care plans, writing and sending prescriptions, ordering tests or procedures and directly communicating with the patient by discussing pertinent items from her history and physical exam as well as detailing my assessment and plan as noted above so that she has an informed understanding.  All of her questions were answered.  Elonda Husky, M.D. 09/10/2020 3:40 PM

## 2020-10-03 ENCOUNTER — Ambulatory Visit (INDEPENDENT_AMBULATORY_CARE_PROVIDER_SITE_OTHER): Payer: BC Managed Care – PPO | Admitting: Surgical

## 2020-10-03 ENCOUNTER — Other Ambulatory Visit: Payer: Self-pay | Admitting: Obstetrics and Gynecology

## 2020-10-03 ENCOUNTER — Other Ambulatory Visit: Payer: Self-pay

## 2020-10-03 VITALS — BP 128/73 | HR 91 | Ht 63.0 in | Wt 171.8 lb

## 2020-10-03 DIAGNOSIS — Z3491 Encounter for supervision of normal pregnancy, unspecified, first trimester: Secondary | ICD-10-CM | POA: Diagnosis not present

## 2020-10-03 DIAGNOSIS — Z113 Encounter for screening for infections with a predominantly sexual mode of transmission: Secondary | ICD-10-CM

## 2020-10-03 NOTE — Progress Notes (Signed)
Kathy Pham presents for NOB nurse interview visit. Pregnancy confirmation done _4/6/2022_. G-2. P0010-. Pregnancy education material explained and given. 1 cats in home. NOB labs ordered. TSH/HbgA1c ordered due to BMI 30 or greater. Sickle cell ordered due to patient's race. HIV labs and drug screen were explained and ordered. PNV encouraged. Genetic screening options discussed. Genetic testing: Unsure. Patient may discuss with the provider. Patient to follow up with provider  _5/18/2022 for NOB physical. All questions answered. Patient has signed FMLA and

## 2020-10-04 LAB — ABO AND RH: Rh Factor: POSITIVE

## 2020-10-04 LAB — CBC WITH DIFFERENTIAL/PLATELET
Basophils Absolute: 0 10*3/uL (ref 0.0–0.2)
Basos: 0 %
EOS (ABSOLUTE): 0 10*3/uL (ref 0.0–0.4)
Eos: 0 %
Hematocrit: 40 % (ref 34.0–46.6)
Hemoglobin: 13.8 g/dL (ref 11.1–15.9)
Immature Grans (Abs): 0 10*3/uL (ref 0.0–0.1)
Immature Granulocytes: 0 %
Lymphocytes Absolute: 1.5 10*3/uL (ref 0.7–3.1)
Lymphs: 17 %
MCH: 30.9 pg (ref 26.6–33.0)
MCHC: 34.5 g/dL (ref 31.5–35.7)
MCV: 90 fL (ref 79–97)
Monocytes Absolute: 0.6 10*3/uL (ref 0.1–0.9)
Monocytes: 7 %
Neutrophils Absolute: 6.5 10*3/uL (ref 1.4–7.0)
Neutrophils: 76 %
Platelets: 219 10*3/uL (ref 150–450)
RBC: 4.47 x10E6/uL (ref 3.77–5.28)
RDW: 12.1 % (ref 11.7–15.4)
WBC: 8.7 10*3/uL (ref 3.4–10.8)

## 2020-10-04 LAB — URINALYSIS, ROUTINE W REFLEX MICROSCOPIC
Bilirubin, UA: NEGATIVE
Glucose, UA: NEGATIVE
Ketones, UA: NEGATIVE
Leukocytes,UA: NEGATIVE
Nitrite, UA: NEGATIVE
Protein,UA: NEGATIVE
RBC, UA: NEGATIVE
Specific Gravity, UA: 1.022 (ref 1.005–1.030)
Urobilinogen, Ur: 1 mg/dL (ref 0.2–1.0)
pH, UA: 7 (ref 5.0–7.5)

## 2020-10-04 LAB — HEMOGLOBIN A1C
Est. average glucose Bld gHb Est-mCnc: 105 mg/dL
Hgb A1c MFr Bld: 5.3 % (ref 4.8–5.6)

## 2020-10-04 LAB — HCV INTERPRETATION

## 2020-10-04 LAB — VIRAL HEPATITIS HBV, HCV
HCV Ab: <0.1 s/co ratio (ref 0.0–0.9)
Hep B Core Total Ab: NEGATIVE
Hep B Surface Ab, Qual: NONREACTIVE
Hepatitis B Surface Ag: NEGATIVE

## 2020-10-04 LAB — VARICELLA ZOSTER ANTIBODY, IGG: Varicella zoster IgG: 3334 index (ref >165)

## 2020-10-04 LAB — ANTIBODY SCREEN: Antibody Screen: NEGATIVE

## 2020-10-04 LAB — TOXOPLASMA ANTIBODIES- IGG AND  IGM
Toxoplasma Antibody- IgM: <3 AU/mL (ref 0.0–7.9)
Toxoplasma IgG Ratio: <3 IU/mL (ref 0.0–7.1)

## 2020-10-04 LAB — TSH: TSH: 1.36 u[IU]/mL (ref 0.450–4.500)

## 2020-10-04 LAB — HIV ANTIBODY (ROUTINE TESTING W REFLEX): HIV Screen 4th Generation wRfx: NONREACTIVE

## 2020-10-04 LAB — RPR: RPR Ser Ql: NONREACTIVE

## 2020-10-04 LAB — RUBELLA SCREEN: Rubella Antibodies, IGG: 1.78 index (ref >0.99)

## 2020-10-05 LAB — GC/CHLAMYDIA PROBE AMP
Chlamydia trachomatis, NAA: NEGATIVE
Neisseria Gonorrhoeae by PCR: NEGATIVE

## 2020-10-06 LAB — MONITOR DRUG PROFILE 14(MW)
Amphetamine Scrn, Ur: NEGATIVE ng/mL
BARBITURATE SCREEN URINE: NEGATIVE ng/mL
BENZODIAZEPINE SCREEN, URINE: NEGATIVE ng/mL
Buprenorphine, Urine: NEGATIVE ng/mL
CANNABINOIDS UR QL SCN: NEGATIVE ng/mL
Cocaine (Metab) Scrn, Ur: NEGATIVE ng/mL
Creatinine(Crt), U: 145 mg/dL (ref 20.0–300.0)
Fentanyl, Urine: NEGATIVE pg/mL
Meperidine Screen, Urine: NEGATIVE ng/mL
Methadone Screen, Urine: NEGATIVE ng/mL
OXYCODONE+OXYMORPHONE UR QL SCN: NEGATIVE ng/mL
Opiate Scrn, Ur: NEGATIVE ng/mL
Ph of Urine: 7.4 (ref 4.5–8.9)
Phencyclidine Qn, Ur: NEGATIVE ng/mL
Propoxyphene Scrn, Ur: NEGATIVE ng/mL
SPECIFIC GRAVITY: 1.027
Tramadol Screen, Urine: NEGATIVE ng/mL

## 2020-10-07 ENCOUNTER — Telehealth: Payer: Self-pay | Admitting: Obstetrics and Gynecology

## 2020-10-07 LAB — CULTURE, OB URINE

## 2020-10-07 LAB — URINE CULTURE, OB REFLEX

## 2020-10-07 NOTE — Telephone Encounter (Signed)
New Message:  Pt is wanting to discuss lab results

## 2020-10-07 NOTE — Telephone Encounter (Signed)
Please advise on labs.

## 2020-10-09 ENCOUNTER — Other Ambulatory Visit: Payer: Self-pay | Admitting: Surgical

## 2020-10-09 MED ORDER — NITROFURANTOIN MONOHYD MACRO 100 MG PO CAPS: 100.0000 mg | ORAL_CAPSULE | Freq: Two times a day (BID) | ORAL | 0 refills | Status: DC

## 2020-10-22 ENCOUNTER — Encounter: Payer: Self-pay | Admitting: Obstetrics and Gynecology

## 2020-10-22 ENCOUNTER — Other Ambulatory Visit: Payer: Self-pay

## 2020-10-22 ENCOUNTER — Ambulatory Visit (INDEPENDENT_AMBULATORY_CARE_PROVIDER_SITE_OTHER): Payer: BC Managed Care – PPO | Admitting: Obstetrics and Gynecology

## 2020-10-22 VITALS — BP 130/81 | HR 105 | Wt 166.0 lb

## 2020-10-22 DIAGNOSIS — Z00129 Encounter for routine child health examination without abnormal findings: Secondary | ICD-10-CM | POA: Insufficient documentation

## 2020-10-22 DIAGNOSIS — R040 Epistaxis: Secondary | ICD-10-CM | POA: Insufficient documentation

## 2020-10-22 DIAGNOSIS — Z9229 Personal history of other drug therapy: Secondary | ICD-10-CM | POA: Insufficient documentation

## 2020-10-22 DIAGNOSIS — J069 Acute upper respiratory infection, unspecified: Secondary | ICD-10-CM | POA: Insufficient documentation

## 2020-10-22 DIAGNOSIS — Z3481 Encounter for supervision of other normal pregnancy, first trimester: Secondary | ICD-10-CM | POA: Diagnosis not present

## 2020-10-22 DIAGNOSIS — Z3A12 12 weeks gestation of pregnancy: Secondary | ICD-10-CM

## 2020-10-22 DIAGNOSIS — R002 Palpitations: Secondary | ICD-10-CM | POA: Insufficient documentation

## 2020-10-22 DIAGNOSIS — M545 Low back pain, unspecified: Secondary | ICD-10-CM | POA: Insufficient documentation

## 2020-10-22 DIAGNOSIS — R5383 Other fatigue: Secondary | ICD-10-CM | POA: Insufficient documentation

## 2020-10-22 DIAGNOSIS — J029 Acute pharyngitis, unspecified: Secondary | ICD-10-CM | POA: Insufficient documentation

## 2020-10-22 LAB — POCT URINALYSIS DIPSTICK OB
Bilirubin, UA: NEGATIVE
Blood, UA: NEGATIVE
Glucose, UA: NEGATIVE
Ketones, UA: NEGATIVE
Leukocytes, UA: NEGATIVE
Nitrite, UA: NEGATIVE
POC,PROTEIN,UA: NEGATIVE
Spec Grav, UA: 1.01 (ref 1.010–1.025)
Urobilinogen, UA: 0.2 E.U./dL
pH, UA: 5 (ref 5.0–8.0)

## 2020-10-22 NOTE — Progress Notes (Signed)
NOB: Reports her nausea is resolving.  Taking vitamins as directed.  Desires genetic testing.  First ultrasound scheduled for next week.  Anatomy scan scheduled for 20 weeks.  aFP next visit.  Patient took Macrobid for GBS bacteriuria.  Physical examination General NAD, Conversant  HEENT Atraumatic; Op clear with mmm.  Normo-cephalic. Pupils reactive. Anicteric sclerae  Thyroid/Neck Smooth without nodularity or enlargement. Normal ROM.  Neck Supple.  Skin No rashes, lesions or ulceration. Normal palpated skin turgor. No nodularity.  Breasts: No masses or discharge.  Symmetric.  No axillary adenopathy.  Lungs: Clear to auscultation.No rales or wheezes. Normal Respiratory effort, no retractions.  Heart: NSR.  No murmurs or rubs appreciated. No periferal edema  Abdomen: Soft.  Non-tender.  No masses.  No HSM. No hernia  Extremities: Moves all appropriately.  Normal ROM for age. No lymphadenopathy.  Neuro: Oriented to PPT.  Normal mood. Normal affect.     Pelvic:   Vulva: Normal appearance.  No lesions.  Vagina: No lesions or abnormalities noted.  Support: Normal pelvic support.  Urethra No masses tenderness or scarring.  Meatus Normal size without lesions or prolapse.  Cervix: Normal appearance.  No lesions.  Anus: Normal exam.  No lesions.  Perineum: Normal exam.  No lesions.        Bimanual   Adnexae: No masses.  Non-tender to palpation.  Uterus: Enlarged.  12 weeks, positive fetal heart tones.  Non-tender.  Mobile.  AV.  Adnexae: No masses.  Non-tender to palpation.  Cul-de-sac: Negative for abnormality.  Adnexae: No masses.  Non-tender to palpation.         Pelvimetry   Diagonal: Reached.  Spines: Average.  Sacrum: Concave.  Pubic Arch: Normal.

## 2020-10-23 LAB — CBC WITH DIFFERENTIAL/PLATELET
Basophils Absolute: 0 10*3/uL (ref 0.0–0.2)
Basos: 0 %
EOS (ABSOLUTE): 0 10*3/uL (ref 0.0–0.4)
Eos: 0 %
Hematocrit: 38.2 % (ref 34.0–46.6)
Hemoglobin: 13.1 g/dL (ref 11.1–15.9)
Immature Grans (Abs): 0 10*3/uL (ref 0.0–0.1)
Immature Granulocytes: 0 %
Lymphocytes Absolute: 2.2 10*3/uL (ref 0.7–3.1)
Lymphs: 23 %
MCH: 30.2 pg (ref 26.6–33.0)
MCHC: 34.3 g/dL (ref 31.5–35.7)
MCV: 88 fL (ref 79–97)
Monocytes Absolute: 0.5 10*3/uL (ref 0.1–0.9)
Monocytes: 6 %
Neutrophils Absolute: 6.9 10*3/uL (ref 1.4–7.0)
Neutrophils: 71 %
Platelets: 220 10*3/uL (ref 150–450)
RBC: 4.34 x10E6/uL (ref 3.77–5.28)
RDW: 12.1 % (ref 11.7–15.4)
WBC: 9.7 10*3/uL (ref 3.4–10.8)

## 2020-10-26 LAB — MATERNIT21  PLUS CORE+ESS+SCA, BLOOD

## 2020-10-27 ENCOUNTER — Ambulatory Visit
Admission: RE | Admit: 2020-10-27 | Discharge: 2020-10-27 | Disposition: A | Discharge status: Home / Self Care | Payer: BC Managed Care – PPO | Source: Ambulatory Visit | Attending: Obstetrics and Gynecology | Admitting: Obstetrics and Gynecology

## 2020-10-27 ENCOUNTER — Other Ambulatory Visit: Payer: Self-pay

## 2020-10-27 ENCOUNTER — Other Ambulatory Visit: Payer: Self-pay | Admitting: Obstetrics and Gynecology

## 2020-10-27 DIAGNOSIS — N912 Amenorrhea, unspecified: Secondary | ICD-10-CM | POA: Insufficient documentation

## 2020-10-27 DIAGNOSIS — O3411 Maternal care for benign tumor of corpus uteri, first trimester: Secondary | ICD-10-CM | POA: Diagnosis not present

## 2020-10-27 DIAGNOSIS — Z3A12 12 weeks gestation of pregnancy: Secondary | ICD-10-CM | POA: Diagnosis not present

## 2020-11-19 ENCOUNTER — Other Ambulatory Visit: Payer: Self-pay

## 2020-11-19 ENCOUNTER — Ambulatory Visit (INDEPENDENT_AMBULATORY_CARE_PROVIDER_SITE_OTHER): Payer: BC Managed Care – PPO | Admitting: Obstetrics and Gynecology

## 2020-11-19 ENCOUNTER — Encounter: Payer: Self-pay | Admitting: Obstetrics and Gynecology

## 2020-11-19 VITALS — BP 115/79 | HR 101 | Wt 166.1 lb

## 2020-11-19 DIAGNOSIS — Z1379 Encounter for other screening for genetic and chromosomal anomalies: Secondary | ICD-10-CM | POA: Diagnosis not present

## 2020-11-19 DIAGNOSIS — R102 Pelvic and perineal pain: Secondary | ICD-10-CM

## 2020-11-19 DIAGNOSIS — O26899 Other specified pregnancy related conditions, unspecified trimester: Secondary | ICD-10-CM

## 2020-11-19 DIAGNOSIS — Z3402 Encounter for supervision of normal first pregnancy, second trimester: Secondary | ICD-10-CM

## 2020-11-19 DIAGNOSIS — R8271 Bacteriuria: Secondary | ICD-10-CM | POA: Insufficient documentation

## 2020-11-19 DIAGNOSIS — Z3A15 15 weeks gestation of pregnancy: Secondary | ICD-10-CM

## 2020-11-19 LAB — POCT URINALYSIS DIPSTICK OB
Bilirubin, UA: NEGATIVE
Blood, UA: NEGATIVE
Glucose, UA: NEGATIVE
Ketones, UA: NEGATIVE
Leukocytes, UA: NEGATIVE
Nitrite, UA: NEGATIVE
POC,PROTEIN,UA: NEGATIVE
Spec Grav, UA: 1.01 (ref 1.010–1.025)
Urobilinogen, UA: 0.2 E.U./dL
pH, UA: 7 (ref 5.0–8.0)

## 2020-11-19 NOTE — Progress Notes (Signed)
ROB: Does note some cramping. Denies bleeding.  Advised on Tylenol, warm compresses. Normal MaterniT21, for AFP today. Scheduled for anatomy scan by next visit. RTC in 4 weeks.

## 2020-11-19 NOTE — Progress Notes (Signed)
ROB: She has some questions about cramping that comes and goes and can be pretty intense pain.

## 2020-11-28 LAB — AFP, SERUM, OPEN SPINA BIFIDA
AFP MoM: 0.93
AFP Value: 30.2 ng/mL
Gest. Age on Collection Date: 16 weeks
Maternal Age At EDD: 25.7 yr
OSBR Risk 1 IN: 10000
Test Results:: NEGATIVE
Weight: 166 [lb_av]

## 2020-12-16 ENCOUNTER — Other Ambulatory Visit: Payer: Self-pay

## 2020-12-16 ENCOUNTER — Ambulatory Visit
Admission: RE | Admit: 2020-12-16 | Discharge: 2020-12-16 | Disposition: A | Discharge status: Home / Self Care | Payer: Medicaid Other | Source: Ambulatory Visit | Attending: Obstetrics and Gynecology | Admitting: Obstetrics and Gynecology

## 2020-12-16 DIAGNOSIS — Z3481 Encounter for supervision of other normal pregnancy, first trimester: Secondary | ICD-10-CM | POA: Insufficient documentation

## 2020-12-17 ENCOUNTER — Other Ambulatory Visit: Payer: Self-pay

## 2020-12-17 ENCOUNTER — Ambulatory Visit (INDEPENDENT_AMBULATORY_CARE_PROVIDER_SITE_OTHER): Payer: BC Managed Care – PPO | Admitting: Obstetrics and Gynecology

## 2020-12-17 VITALS — BP 123/81 | HR 101 | Wt 168.5 lb

## 2020-12-17 DIAGNOSIS — Z3402 Encounter for supervision of normal first pregnancy, second trimester: Secondary | ICD-10-CM

## 2020-12-17 DIAGNOSIS — Z3A20 20 weeks gestation of pregnancy: Secondary | ICD-10-CM

## 2020-12-17 NOTE — Progress Notes (Signed)
ROB: Patient is just beginning to feel fetal movement.  Taking vitamins as directed.  States that she has approximately 5 headaches per week.  They are not migraines.  She has tried Tylenol without success.  I discussed increased hydration and possible use of Claritin as a trial.  Anatomy ultrasound completed but report not yet available.

## 2021-01-21 ENCOUNTER — Other Ambulatory Visit: Payer: Self-pay

## 2021-01-21 ENCOUNTER — Encounter: Payer: Self-pay | Admitting: Obstetrics and Gynecology

## 2021-01-21 ENCOUNTER — Ambulatory Visit (INDEPENDENT_AMBULATORY_CARE_PROVIDER_SITE_OTHER): Payer: BC Managed Care – PPO | Admitting: Obstetrics and Gynecology

## 2021-01-21 VITALS — BP 128/87 | HR 103 | Wt 175.7 lb

## 2021-01-21 DIAGNOSIS — Z3482 Encounter for supervision of other normal pregnancy, second trimester: Secondary | ICD-10-CM

## 2021-01-21 DIAGNOSIS — Z3A25 25 weeks gestation of pregnancy: Secondary | ICD-10-CM

## 2021-01-21 DIAGNOSIS — L740 Miliaria rubra: Secondary | ICD-10-CM

## 2021-01-21 LAB — POCT URINALYSIS DIPSTICK OB
Bilirubin, UA: NEGATIVE
Blood, UA: NEGATIVE
Glucose, UA: NEGATIVE
Ketones, UA: NEGATIVE
Leukocytes, UA: NEGATIVE
Nitrite, UA: NEGATIVE
POC,PROTEIN,UA: NEGATIVE
Spec Grav, UA: <=1.005 — AB (ref 1.010–1.025)
Urobilinogen, UA: 0.2 E.U./dL
pH, UA: 7 (ref 5.0–8.0)

## 2021-01-21 NOTE — Progress Notes (Signed)
ROB: Notes rash between breasts for the past 2 days.  Has not tried anything on it. Is itchy.  Has appearance of heat rash. Advised on home treatment measures.  Anatomy scan normal, small fibroid (1.6 cm) noted.  Breastfeeding.  Discussed doula program as patient desires natural labor. RTC in 4 weeks.   The following were addressed during this visit:  Breastfeeding Education - Early initiation of breastfeeding    Comments: Keeps milk supply adequate, helps contract uterus and slow bleeding, and early milk is the perfect first food and is easy to digest.   - The importance of exclusive breastfeeding    Comments: Provides antibodies, Lower risk of breast and ovarian cancers, and type-2 diabetes,Helps your body recover, Reduced chance of SIDS.   - Risks of giving your baby anything other than breast milk if you are breastfeeding    Comments: Make the baby less content with breastfeeds, may make my baby more susceptible to illness, and may reduce my milk supply.   - Nonpharmacological pain relief methods for labor    Comments: Deep breathing, focusing on pleasant things, movement and walking, heating pads or cold compress, massage and relaxation, continuous support from someone you trust, and Doulas   - Effective positioning and attachment    Comments: Helps my baby to get enough breast milk, helps to produce an adequate milk supply, and helps prevent nipple pain and damage   - Exclusive breastfeeding for the first 6 months    Comments: Builds a healthy milk supply and keeps it up, protects baby from sickness and disease, and breastmilk has everything your baby needs for the first 6 months.

## 2021-01-21 NOTE — Progress Notes (Signed)
   OB-Pt present for routine prenatal care. Pt stated fetal movement present; no contractions present; no vaginal bleeding and no changes in vaginal discharge.    1 hour glucose information explained and given to patient.  Pt c/o small rash between and under both breast that itches at times.

## 2021-01-21 NOTE — Patient Instructions (Addendum)
1-Hour Glucose  No dessert the night before No sweet drinks the day of- soda, fruit juice, sweet tea No sweet breakfast- pancakes, donuts May have mostly protein- egg, bacon, wheat toast, black coffee.               Grilled chicken, salad, vegetable, water.       3-Hour Glucose Test  Must be fasting.  Nothing to eat or drink after midnight.  May have morning medication with a sip of water.      Second Trimester of Pregnancy  The second trimester of pregnancy is from week 13 through week 27. This is also called months 4 through 6 of pregnancy. This is often the time when you feelyour best. During the second trimester: Morning sickness is less or has stopped. You may have more energy. You may feel hungry more often. At this time, your unborn baby (fetus) is growing very fast. At the end of the sixth month, the unborn baby may be up to 12 inches long and weigh about 1 pounds. You will likely start to feelthe baby move between 16 and 20 weeks of pregnancy. Body changes during your second trimester Your body continues to go through many changes during this time. The changesvary and generally return to normal after the baby is born. Physical changes You will gain more weight. You may start to get stretch marks on your hips, belly (abdomen), and breasts. Your breasts will grow and may hurt. Dark spots or blotches may develop on your face. A dark line from your belly button to the pubic area (linea nigra) may appear. You may have changes in your hair. Health changes You may have headaches. You may have heartburn. You may have trouble pooping (constipation). You may have hemorrhoids or swollen, bulging veins (varicose veins). Your gums may bleed. You may pee (urinate) more often. You may have back pain. Follow these instructions at home: Medicines Take over-the-counter and prescription medicines only as told by your doctor. Some medicines are not safe during  pregnancy. Take a prenatal vitamin that contains at least 600 micrograms (mcg) of folic acid. Eating and drinking Eat healthy meals that include: Fresh fruits and vegetables. Whole grains. Good sources of protein, such as meat, eggs, or tofu. Low-fat dairy products. Avoid raw meat and unpasteurized juice, milk, and cheese. You may need to take these actions to prevent or treat trouble pooping: Drink enough fluids to keep your pee (urine) pale yellow. Eat foods that are high in fiber. These include beans, whole grains, and fresh fruits and vegetables. Limit foods that are high in fat and sugar. These include fried or sweet foods. Activity Exercise only as told by your doctor. Most people can do their usual exercise during pregnancy. Try to exercise for 30 minutes at least 5 days a week. Stop exercising if you have pain or cramps in your belly or lower back. Do not exercise if it is too hot or too humid, or if you are in a place of great height (high altitude). Avoid heavy lifting. If you choose to, you may have sex unless your doctor tells you not to. Relieving pain and discomfort Wear a good support bra if your breasts are sore. Take warm water baths (sitz baths) to soothe pain or discomfort caused by hemorrhoids. Use hemorrhoid cream if your doctor approves. Rest with your legs raised (elevated) if you have leg cramps or low back pain. If you develop bulging veins in your legs: Wear support hose  as told by your doctor. Raise your feet for 15 minutes, 3-4 times a day. Limit salt in your food. Safety Wear your seat belt at all times when you are in a car. Talk with your doctor if someone is hurting you or yelling at you a lot. Lifestyle Do not use hot tubs, steam rooms, or saunas. Do not douche. Do not use tampons or scented sanitary pads. Avoid cat litter boxes and soil used by cats. These carry germs that can harm your baby and can cause a loss of your baby by miscarriage or  stillbirth. Do not use herbal medicines, illegal drugs, or medicines that are not approved by your doctor. Do not drink alcohol. Do not smoke or use any products that contain nicotine or tobacco. If you need help quitting, ask your doctor. General instructions Keep all follow-up visits. This is important. Ask your doctor about local prenatal classes. Ask your doctor about the right foods to eat or for help finding a counselor. Where to find more information American Pregnancy Association: americanpregnancy.org [employer]: www.acog.org Office on [employer] Health: Mighty[employer]co.nz Contact a doctor if: You have a headache that does not go away when you take medicine. You have changes in how you see, or you see spots in front of your eyes. You have mild cramps, pressure, or pain in your lower belly. You continue to feel like you may vomit (nauseous), you vomit, or you have watery poop (diarrhea). You have bad-smelling fluid coming from your vagina. You have pain when you pee or your pee smells bad. You have very bad swelling of your face, hands, ankles, feet, or legs. You have a fever. Get help right away if: You are leaking fluid from your vagina. You have spotting or bleeding from your vagina. You have very bad belly cramping or pain. You have trouble breathing. You have chest pain. You faint. You have not felt your baby move for the time period told by your doctor. You have new or increased pain, swelling, or redness in an arm or leg. Summary The second trimester of pregnancy is from week 13 through week 27 (months 4 through 6). Eat healthy meals. Exercise as told by your doctor. Most people can do their usual exercise during pregnancy. Do not use herbal medicines, illegal drugs, or medicines that are not approved by your doctor. Do not drink alcohol. Call your doctor if you get sick or if you notice anything unusual about your  pregnancy. This information is not intended to replace advice given to you by your health care provider. Make sure you discuss any questions you have with your healthcare provider. Document Revised: 10/31/2019 Document Reviewed: 09/06/2019 Elsevier Patient Education  2022 [employer].    What are the benefits of having a doula?  A doula is a Data processing manager who provides physical and emotional support to you and your partner during pregnancy, childbirth and the postpartum period.  For instance, a doula might offer:  Attention to physical comfort through techniques such as touch and massage and assistance with breathing Emotional reassurance, comfort and encouragement Information about what's happening during labor and the postpartum period, including explanations of procedures Help with facilitating communication between you and the hospital staff Guidance and support for loved ones Assistance with breast-feeding Often, however, a doula's most important role is to provide continuous support during labor and delivery. Although research is limited, some studies have shown that continuous support from doulas during childbirth might be  associated with:  A decreased use of pain relief medication during labor A decreased incidence of C-sections A decrease in the length of labor A decrease in negative childbirth experiences A doula might add another opinion to the mix when decisions need to be made about labor and delivery. However, a doula doesn't provide medical advice, nor can she or he change the clinical recommendations of a midwife or an obstetrician. Also, fees and insurance coverage vary.  If you're interested in hiring a doula, ask your health care provider, childbirth instructor, family or friends for recommendations. You might also contact your local hospital for a referral.  When interviewing potential doulas, ask about their training, how many births they've attended,  their philosophies about childbirth, what services they provide, and the cost. Also, discuss your preferences and concerns about pregnancy, labor and delivery.   If you are interested in a doula, please email at: [email or web address]. We do have a phone number, but it is a voicemail only, [phone]. We can then call the client back and collect info from them to ensure they qualify for no-cost doula services and match them with a doula.   You may also complete a Request Form online.  Please view the Doula Request Form ( [email or web address]) and submit it if you meet the qualifying criteria listed within the form. If you do not meet the criteria for the program to receive a doula at no cost, we have attached a list of private doulas who are credentialed with [facility] that you can connect with and retain for your birth.   Currently, our program is only for birth, but most of our doulas connect with clients and provide support in the prenatal period as well.

## 2021-02-23 ENCOUNTER — Encounter: Payer: Self-pay | Admitting: Obstetrics and Gynecology

## 2021-02-23 ENCOUNTER — Other Ambulatory Visit: Payer: BC Managed Care – PPO

## 2021-02-23 ENCOUNTER — Ambulatory Visit (INDEPENDENT_AMBULATORY_CARE_PROVIDER_SITE_OTHER): Payer: BC Managed Care – PPO | Admitting: Obstetrics and Gynecology

## 2021-02-23 ENCOUNTER — Other Ambulatory Visit: Payer: Self-pay

## 2021-02-23 VITALS — BP 124/80 | HR 102 | Wt 183.3 lb

## 2021-02-23 DIAGNOSIS — Z3482 Encounter for supervision of other normal pregnancy, second trimester: Secondary | ICD-10-CM

## 2021-02-23 DIAGNOSIS — Z23 Encounter for immunization: Secondary | ICD-10-CM | POA: Diagnosis not present

## 2021-02-23 DIAGNOSIS — Z3483 Encounter for supervision of other normal pregnancy, third trimester: Secondary | ICD-10-CM | POA: Diagnosis not present

## 2021-02-23 DIAGNOSIS — Z3A29 29 weeks gestation of pregnancy: Secondary | ICD-10-CM

## 2021-02-23 LAB — POCT URINALYSIS DIPSTICK OB
Bilirubin, UA: NEGATIVE
Blood, UA: NEGATIVE
Glucose, UA: NEGATIVE
Ketones, UA: NEGATIVE
Leukocytes, UA: NEGATIVE
Nitrite, UA: NEGATIVE
POC,PROTEIN,UA: NEGATIVE
Spec Grav, UA: 1.015 (ref 1.010–1.025)
Urobilinogen, UA: 0.2 E.U./dL
pH, UA: 7.5 (ref 5.0–8.0)

## 2021-02-23 MED ORDER — TETANUS-DIPHTH-ACELL PERTUSSIS 5-2.5-18.5 LF-MCG/0.5 IM SUSY
0.5000 mL | PREFILLED_SYRINGE | Freq: Once | INTRAMUSCULAR | Status: AC
  Administered 2021-02-23: 0.5 mL via INTRAMUSCULAR

## 2021-02-23 NOTE — Progress Notes (Signed)
  OB-Pt present for routine prenatal care. Pt stated fetal movement present; braxton hick contractions present; no vaginal bleeding and no changes in vaginal discharge.     Pt c/o pelvic pressure.   Tdap/BTC completed. Pt declined flu vaccine.

## 2021-02-23 NOTE — Patient Instructions (Signed)
Tdap (Tetanus, Diphtheria, Pertussis) Vaccine: What You Need to Know 1. Why get vaccinated? Tdap vaccine can prevent tetanus, diphtheria, and pertussis. Diphtheria and pertussis spread from person to person. Tetanus enters the body through cuts or wounds. TETANUS (T) causes painful stiffening of the muscles. Tetanus can lead to serious health problems, including being unable to open the mouth, having trouble swallowing and breathing, or death. DIPHTHERIA (D) can lead to difficulty breathing, heart failure, paralysis, or death. PERTUSSIS (aP), also known as "whooping cough," can cause uncontrollable, violent coughing that makes it hard to breathe, eat, or drink. Pertussis can be extremely serious especially in babies and young children, causing pneumonia, convulsions, brain damage, or death. In teens and adults, it can cause weight loss, loss of bladder control, passing out, and rib fractures from severe coughing. 2. Tdap vaccine Tdap is only for children 7 years and older, adolescents, and adults.  Adolescents should receive a single dose of Tdap, preferably at age 28 or 75 years. Pregnant people should get a dose of Tdap during every pregnancy, preferably during the early part of the third trimester, to help protect the newborn from pertussis. Infants are most at risk for severe, life-threatening complications from pertussis. Adults who have never received Tdap should get a dose of Tdap. Also, adults should receive a booster dose of either Tdap or Td (a different vaccine that protects against tetanus and diphtheria but not pertussis) every 10 years, or after 5 years in the case of a severe or dirty wound or burn. Tdap may be given at the same time as other vaccines. 3. Talk with your health care provider Tell your vaccine provider if the person getting the vaccine: Has had an allergic reaction after a previous dose of any vaccine that protects against tetanus, diphtheria, or pertussis, or has any  severe, life-threatening allergies Has had a coma, decreased level of consciousness, or prolonged seizures within 7 days after a previous dose of any pertussis vaccine (DTP, DTaP, or Tdap) Has seizures or another nervous system problem Has ever had Guillain-Barr Syndrome (also called "GBS") Has had severe pain or swelling after a previous dose of any vaccine that protects against tetanus or diphtheria In some cases, your health care provider may decide to postpone Tdap vaccination until a future visit. People with minor illnesses, such as a cold, may be vaccinated. People who are moderately or severely ill should usually wait until they recover before getting Tdap vaccine.  Your health care provider can give you more information. 4. Risks of a vaccine reaction Pain, redness, or swelling where the shot was given, mild fever, headache, feeling tired, and nausea, vomiting, diarrhea, or stomachache sometimes happen after Tdap vaccination. People sometimes faint after medical procedures, including vaccination. Tell your provider if you feel dizzy or have vision changes or ringing in the ears.  As with any medicine, there is a very remote chance of a vaccine causing a severe allergic reaction, other serious injury, or death. 5. What if there is a serious problem? An allergic reaction could occur after the vaccinated person leaves the clinic. If you see signs of a severe allergic reaction (hives, swelling of the face and throat, difficulty breathing, a fast heartbeat, dizziness, or weakness), call 9-1-1 and get the person to the nearest hospital. For other signs that concern you, call your health care provider.  Adverse reactions should be reported to the Vaccine Adverse Event Reporting System (VAERS). Your health care provider will usually file this report, or you  can do it yourself. Visit the VAERS website at www.vaers.SamedayNews.es or call 204-320-2657. VAERS is only for reporting reactions, and VAERS staff  members do not give medical advice. 6. The National Vaccine Injury Compensation Program The [employer] Vaccine Injury Compensation Program (VICP) is a federal program that was created to compensate people who may have been injured by certain vaccines. Claims regarding alleged injury or death due to vaccination have a time limit for filing, which may be as short as two years. Visit the VICP website at GoldCloset.com.ee or call 3851236574 to learn about the program and about filing a claim. 7. How can I learn more? Ask your health care provider. Call your local or state health department. Visit the website of the Food and Drug Administration (FDA) for vaccine package inserts and additional information at TraderRating.uy. Contact the Centers for Disease Control and Prevention (CDC): Call 226-478-1399 (1-800-CDC-INFO) or Visit CDC's website at http://hunter.com/. Vaccine Information Statement Tdap (Tetanus, Diphtheria, Pertussis) Vaccine (01/11/2020) This information is not intended to replace advice given to you by your health care provider. Make sure you discuss any questions you have with your health care provider. Document Revised: 02/06/2020 Document Reviewed: 02/06/2020 Elsevier Patient Education  2022 [place] of Pregnancy The third trimester of pregnancy is from week 28 through week 31. This is also called months 7 through 9. This trimester is when your unborn baby (fetus) is growing very fast. At the end of the ninth month, the unborn baby is about 20 inches long. It weighs about 6-10 pounds. Body changes during your third trimester Your body continues to go through many changes during this time. The changes vary and generally return to normal after the baby is born. Physical changes Your weight will continue to increase. You may gain 25-35 pounds (11-16 kg) by the end of the pregnancy. If you are underweight, you  may gain 28-40 lb (about 13-18 kg). If you are overweight, you may gain 15-25 lb (about 7-11 kg). You may start to get stretch marks on your hips, belly (abdomen), and breasts. Your breasts will continue to grow and may hurt. A yellow fluid (colostrum) may leak from your breasts. This is the first milk you are making for your baby. You may have changes in your hair. Your belly button may stick out. You may have more swelling in your hands, face, or ankles. Health changes You may have heartburn. You may have trouble pooping (constipation). You may get hemorrhoids. These are swollen veins in the butt that can itch or get painful. You may have swollen veins (varicose veins) in your legs. You may have more body aches in the pelvis, back, or thighs. You may have more tingling or numbness in your hands, arms, and legs. The skin on your belly may also feel numb. You may feel short of breath as your womb (uterus) gets bigger. Other changes You may pee (urinate) more often. You may have more problems sleeping. You may notice the unborn baby "dropping," or moving lower in your belly. You may have more discharge coming from your vagina. Your joints may feel loose, and you may have pain around your pelvic bone. Follow these instructions at home: Medicines Take over-the-counter and prescription medicines only as told by your doctor. Some medicines are not safe during pregnancy. Take a prenatal vitamin that contains at least 600 micrograms (mcg) of folic acid. Eating and drinking Eat healthy meals that include: Fresh fruits and vegetables. Whole grains. Good sources  of protein, such as meat, eggs, or tofu. Low-fat dairy products. Avoid raw meat and unpasteurized juice, milk, and cheese. These carry germs that can harm you and your baby. Eat 4 or 5 small meals rather than 3 large meals a day. You may need to take these actions to prevent or treat trouble pooping: Drink enough fluids to keep your  pee (urine) pale yellow. Eat foods that are high in fiber. These include beans, whole grains, and fresh fruits and vegetables. Limit foods that are high in fat and sugar. These include fried or sweet foods. Activity Exercise only as told by your doctor. Stop exercising if you start to have cramps in your womb. Avoid heavy lifting. Do not exercise if it is too hot or too humid, or if you are in a place of great height (high altitude). If you choose to, you may have sex unless your doctor tells you not to. Relieving pain and discomfort Take breaks often, and rest with your legs raised (elevated) if you have leg cramps or low back pain. Take warm water baths (sitz baths) to soothe pain or discomfort caused by hemorrhoids. Use hemorrhoid cream if your doctor approves. Wear a good support bra if your breasts are tender. If you develop bulging, swollen veins in your legs: Wear support hose as told by your doctor. Raise your feet for 15 minutes, 3-4 times a day. Limit salt in your food. Safety Talk to your doctor before traveling far distances. Do not use hot tubs, steam rooms, or saunas. Wear your seat belt at all times when you are in a car. Talk with your doctor if someone is hurting you or yelling at you a lot. Preparing for your baby's arrival To prepare for the arrival of your baby: Take prenatal classes. Visit the hospital and tour the maternity area. Buy a rear-facing car seat. Learn how to install it in your car. Prepare the baby's room. Take out all pillows and stuffed animals from the baby's crib. General instructions Avoid cat litter boxes and soil used by cats. These carry germs that can cause harm to the baby and can cause a loss of your baby by miscarriage or stillbirth. Do not douche or use tampons. Do not use scented sanitary pads. Do not smoke or use any products that contain nicotine or tobacco. If you need help quitting, ask your doctor. Do not drink alcohol. Do not use  herbal medicines, illegal drugs, or medicines that were not approved by your doctor. Chemicals in these products can affect your baby. Keep all follow-up visits. This is important. Where to find more information American Pregnancy Association: americanpregnancy.org [employer]: www.acog.org Office on Women's Health: KeywordPortfolios.com.br Contact a doctor if: You have a fever. You have mild cramps or pressure in your lower belly. You have a nagging pain in your belly area. You vomit, or you have watery poop (diarrhea). You have bad-smelling fluid coming from your vagina. You have pain when you pee, or your pee smells bad. You have a headache that does not go away when you take medicine. You have changes in how you see, or you see spots in front of your eyes. Get help right away if: Your water breaks. You have regular contractions that are less than 5 minutes apart. You are spotting or bleeding from your vagina. You have very bad belly cramps or pain. You have trouble breathing. You have chest pain. You faint. You have not felt the baby move  for the amount of time told by your doctor. You have new or increased pain, swelling, or redness in an arm or leg. Summary The third trimester is from week 28 through week 40 (months 7 through 9). This is the time when your unborn baby is growing very fast. During this time, your discomfort may increase as you gain weight and as your baby grows. Get ready for your baby to arrive by taking prenatal classes, buying a rear-facing car seat, and preparing the baby's room. Get help right away if you are bleeding from your vagina, you have chest pain and trouble breathing, or you have not felt the baby move for the amount of time told by your doctor. This information is not intended to replace advice given to you by your health care provider. Make sure you discuss any questions you have with your health care  provider. Document Revised: 10/31/2019 Document Reviewed: 09/06/2019 Elsevier Patient Education  [place]. Common Medications Safe in Pregnancy  Acne:      Constipation:  Benzoyl Peroxide     Colace  Clindamycin      Dulcolax Suppository  Topica Erythromycin     Fibercon  Salicylic Acid      Metamucil         Miralax AVOID:        Senakot   Accutane    Cough:  Retin-A       Cough Drops  Tetracycline      Phenergan w/ Codeine if Rx  Minocycline      Robitussin (Plain & DM)  Antibiotics:     Crabs/Lice:  Ceclor       RID  Cephalosporins    AVOID:  E-Mycins      Kwell  Keflex  Macrobid/Macrodantin   Diarrhea:  Penicillin      Kao-Pectate  Zithromax      Imodium AD         PUSH FLUIDS AVOID:       Cipro     Fever:  Tetracycline      Tylenol (Regular or Extra  Minocycline       Strength)  Levaquin      Extra Strength-Do not          Exceed 8 tabs/24 hrs Caffeine:        <244m/day (equiv. To 1 cup of coffee or  approx. 3 12 oz sodas)         Gas: Cold/Hayfever:       Gas-X  Benadryl      Mylicon  Claritin       Phazyme  **Claritin-D        Chlor-Trimeton    Headaches:  Dimetapp      ASA-Free Excedrin  Drixoral-Non-Drowsy     Cold Compress  Mucinex (Guaifenasin)     Tylenol (Regular or Extra  Sudafed/Sudafed-12 Hour     Strength)  **Sudafed PE Pseudoephedrine   Tylenol Cold & Sinus     Vicks Vapor Rub  Zyrtec  **AVOID if Problems With Blood Pressure         Heartburn: Avoid lying down for at least 1 hour after meals  Aciphex      Maalox     Rash:  Milk of Magnesia     Benadryl    Mylanta       1% Hydrocortisone Cream  Pepcid  Pepcid Complete   Sleep Aids:  Prevacid      Ambien   Prilosec  Benadryl  Rolaids       Chamomile Tea  Tums (Limit 4/day)     Unisom         Tylenol PM         Warm milk-add vanilla or  Hemorrhoids:       Sugar for taste  Anusol/Anusol H.C.  (RX: Analapram 2.5%)  Sugar Substitutes:  Hydrocortisone OTC     Ok in  moderation  Preparation H      Tucks        Vaseline lotion applied to tissue with wiping    Herpes:     Throat:  Acyclovir      Oragel  Famvir  Valtrex     Vaccines:         Flu Shot Leg Cramps:       *Gardasil  Benadryl      Hepatitis A         Hepatitis B Nasal Spray:       Pneumovax  Saline Nasal Spray     Polio Booster         Tetanus Nausea:       Tuberculosis test or PPD  Vitamin B6 25 mg TID   AVOID:    Dramamine      *Gardasil  Emetrol       Live Poliovirus  Ginger Root 250 mg QID    MMR (measles, mumps &  High Complex Carbs @ Bedtime    rebella)  Sea Bands-Accupressure    Varicella (Chickenpox)  Unisom 1/2 tab TID     *No known complications           If received before Pain:         Known pregnancy;   Darvocet       Resume series after  Lortab        Delivery  Percocet    Yeast:   Tramadol      Femstat  Tylenol 3      Gyne-lotrimin  Ultram       Monistat  Vicodin           MISC:         All Sunscreens           Hair Coloring/highlights          Insect Repellant's          (Including DEET)         Mystic Tans

## 2021-02-23 NOTE — Progress Notes (Signed)
ROB: 1 hour GCT today.  Patient reports daily fetal movement.  Still has rash under her breasts -likely monilia, discussed use of antifungal cream.

## 2021-02-24 LAB — CBC
Hematocrit: 35.7 % (ref 34.0–46.6)
Hemoglobin: 12.4 g/dL (ref 11.1–15.9)
MCH: 31.6 pg (ref 26.6–33.0)
MCHC: 34.7 g/dL (ref 31.5–35.7)
MCV: 91 fL (ref 79–97)
Platelets: 184 10*3/uL (ref 150–450)
RBC: 3.93 x10E6/uL (ref 3.77–5.28)
RDW: 12 % (ref 11.7–15.4)
WBC: 9.6 10*3/uL (ref 3.4–10.8)

## 2021-02-24 LAB — GLUCOSE, 1 HOUR GESTATIONAL: Gestational Diabetes Screen: 162 mg/dL — ABNORMAL HIGH (ref 65–139)

## 2021-02-24 LAB — RPR: RPR Ser Ql: NONREACTIVE

## 2021-03-18 ENCOUNTER — Encounter: Payer: Self-pay | Admitting: Obstetrics and Gynecology

## 2021-03-18 ENCOUNTER — Other Ambulatory Visit: Payer: Self-pay

## 2021-03-18 ENCOUNTER — Ambulatory Visit (INDEPENDENT_AMBULATORY_CARE_PROVIDER_SITE_OTHER): Payer: BC Managed Care – PPO | Admitting: Obstetrics and Gynecology

## 2021-03-18 VITALS — BP 122/77 | HR 107 | Wt 190.8 lb

## 2021-03-18 DIAGNOSIS — L299 Pruritus, unspecified: Secondary | ICD-10-CM

## 2021-03-18 DIAGNOSIS — Z3A33 33 weeks gestation of pregnancy: Secondary | ICD-10-CM

## 2021-03-18 DIAGNOSIS — Z3483 Encounter for supervision of other normal pregnancy, third trimester: Secondary | ICD-10-CM

## 2021-03-18 LAB — POCT URINALYSIS DIPSTICK OB
Bilirubin, UA: NEGATIVE
Glucose, UA: NEGATIVE
Ketones, UA: NEGATIVE
Leukocytes, UA: NEGATIVE
Nitrite, UA: NEGATIVE
POC,PROTEIN,UA: NEGATIVE
Spec Grav, UA: 1.01 (ref 1.010–1.025)
Urobilinogen, UA: 0.2 E.U./dL
pH, UA: 7.5 (ref 5.0–8.0)

## 2021-03-18 NOTE — Patient Instructions (Signed)

## 2021-03-18 NOTE — Progress Notes (Signed)
ROROB: Notes itching of abdomen, mostly on left side near stretch marks. Is using cocoa butter several times daily.  Advised on use of Aloe, or can intermittently use hydrocortisone cream. Abdomen with no evidence of PUPPS on exam. RTC in 2-3 weeks, for 36 week labs at that time (no GBS swab needed).

## 2021-04-01 ENCOUNTER — Encounter: Payer: Self-pay | Admitting: Obstetrics and Gynecology

## 2021-04-01 ENCOUNTER — Other Ambulatory Visit: Payer: Self-pay

## 2021-04-01 ENCOUNTER — Ambulatory Visit (INDEPENDENT_AMBULATORY_CARE_PROVIDER_SITE_OTHER): Payer: BC Managed Care – PPO | Admitting: Obstetrics and Gynecology

## 2021-04-01 VITALS — BP 122/82 | HR 97 | Wt 193.7 lb

## 2021-04-01 DIAGNOSIS — Z3A35 35 weeks gestation of pregnancy: Secondary | ICD-10-CM

## 2021-04-01 DIAGNOSIS — Z3483 Encounter for supervision of other normal pregnancy, third trimester: Secondary | ICD-10-CM

## 2021-04-01 LAB — POCT URINALYSIS DIPSTICK OB
Bilirubin, UA: NEGATIVE
Blood, UA: NEGATIVE
Glucose, UA: NEGATIVE
Ketones, UA: NEGATIVE
Leukocytes, UA: NEGATIVE
Nitrite, UA: NEGATIVE
POC,PROTEIN,UA: NEGATIVE
Spec Grav, UA: 1.015 (ref 1.010–1.025)
Urobilinogen, UA: 0.2 E.U./dL
pH, UA: 7 (ref 5.0–8.0)

## 2021-04-01 NOTE — Progress Notes (Signed)
OB-pt present for routine prenatal care. Pt c/o a lot of pain and pressure. Unable to sleep at night due ot pelvic pain and hip pains when she walks, stands, sits, and lay down.

## 2021-04-01 NOTE — Progress Notes (Signed)
ROB: Generally tired of being pregnant.  Has hip pain, pelvic pain, back pain.  Is having difficulty sleeping because of the hip pain and pelvic pain discomfort.  Discussed possibility of physical therapy patient declined.  Literature given regarding support devices patient may be interested in this.  Reports daily fetal movement.  Cultures next visit.

## 2021-04-14 NOTE — Patient Instructions (Signed)

## 2021-04-15 ENCOUNTER — Ambulatory Visit (INDEPENDENT_AMBULATORY_CARE_PROVIDER_SITE_OTHER): Payer: BC Managed Care – PPO | Admitting: Obstetrics and Gynecology

## 2021-04-15 ENCOUNTER — Encounter: Payer: Self-pay | Admitting: Obstetrics and Gynecology

## 2021-04-15 ENCOUNTER — Other Ambulatory Visit: Payer: Self-pay

## 2021-04-15 ENCOUNTER — Other Ambulatory Visit
Admission: RE | Admit: 2021-04-15 | Discharge: 2021-04-15 | Disposition: A | Discharge status: Home / Self Care | Payer: BC Managed Care – PPO | Attending: Obstetrics and Gynecology | Admitting: Obstetrics and Gynecology

## 2021-04-15 VITALS — BP 123/85 | HR 107 | Wt 203.0 lb

## 2021-04-15 DIAGNOSIS — L299 Pruritus, unspecified: Secondary | ICD-10-CM

## 2021-04-15 DIAGNOSIS — Z3483 Encounter for supervision of other normal pregnancy, third trimester: Secondary | ICD-10-CM

## 2021-04-15 DIAGNOSIS — Z3A37 37 weeks gestation of pregnancy: Secondary | ICD-10-CM

## 2021-04-15 LAB — POCT URINALYSIS DIPSTICK OB
Bilirubin, UA: NEGATIVE
Blood, UA: NEGATIVE
Glucose, UA: NEGATIVE
Ketones, UA: NEGATIVE
Leukocytes, UA: NEGATIVE
Nitrite, UA: NEGATIVE
POC,PROTEIN,UA: NEGATIVE
Spec Grav, UA: <=1.005 — AB (ref 1.010–1.025)
Urobilinogen, UA: 0.2 E.U./dL
pH, UA: 7 (ref 5.0–8.0)

## 2021-04-15 LAB — HEPATIC FUNCTION PANEL
ALT: 12 U/L (ref 0–44)
AST: 19 U/L (ref 15–41)
Albumin: 2.9 g/dL — ABNORMAL LOW (ref 3.5–5.0)
Alkaline Phosphatase: 92 U/L (ref 38–126)
Bilirubin, Direct: <0.1 mg/dL (ref 0.0–0.2)
Total Bilirubin: 0.5 mg/dL (ref 0.3–1.2)
Total Protein: 6.6 g/dL (ref 6.5–8.1)

## 2021-04-15 MED ORDER — HYDROXYZINE HCL 25 MG PO TABS: 25.0000 mg | ORAL_TABLET | Freq: Four times a day (QID) | ORAL | 0 refills | Status: DC | PRN

## 2021-04-15 NOTE — Progress Notes (Signed)
OB-Pt present for routine prenatal care. Pt c/o pain/pressure in pelvic area.

## 2021-04-15 NOTE — Progress Notes (Signed)
ROB: Patient notes stomach is very itchy. Has tried many OTC regimens.  Also not sure if it is a stretch mark or rash. Has tried several OTC topical treatments without relief. No evidence of PUPPS today, but did note some mild pitting of skin at base of abdomen. No itching on palms and soles or generalized itching concerning for cholestasis. 36 week cultures done. Deferred cervical exam today. Can prescribe Atarax. RTC in 1 week.

## 2021-04-16 ENCOUNTER — Telehealth: Payer: Self-pay | Admitting: Obstetrics and Gynecology

## 2021-04-16 DIAGNOSIS — Z3483 Encounter for supervision of other normal pregnancy, third trimester: Secondary | ICD-10-CM | POA: Diagnosis not present

## 2021-04-16 LAB — BILE ACIDS, TOTAL: Bile Acids Total: 4.4 umol/L (ref 0.0–10.0)

## 2021-04-16 NOTE — Telephone Encounter (Signed)
At check out on 11-09- pt presented FMLA paperwork- I collected $25.oo form fee completed cover sheet and placed in CM box on 04-16-2021.

## 2021-04-18 LAB — STREP GP B NAA: Strep Gp B NAA: POSITIVE — AB

## 2021-04-20 LAB — GC/CHLAMYDIA PROBE AMP
Chlamydia trachomatis, NAA: NEGATIVE
Neisseria Gonorrhoeae by PCR: NEGATIVE

## 2021-04-21 NOTE — Patient Instructions (Signed)

## 2021-04-22 ENCOUNTER — Ambulatory Visit (INDEPENDENT_AMBULATORY_CARE_PROVIDER_SITE_OTHER): Payer: Medicaid Other | Admitting: Obstetrics and Gynecology

## 2021-04-22 ENCOUNTER — Encounter: Payer: Self-pay | Admitting: Obstetrics and Gynecology

## 2021-04-22 ENCOUNTER — Other Ambulatory Visit: Payer: Self-pay

## 2021-04-22 VITALS — BP 142/81 | HR 93 | Wt 204.7 lb

## 2021-04-22 DIAGNOSIS — Z3A38 38 weeks gestation of pregnancy: Secondary | ICD-10-CM

## 2021-04-22 DIAGNOSIS — Z3483 Encounter for supervision of other normal pregnancy, third trimester: Secondary | ICD-10-CM

## 2021-04-22 LAB — POCT URINALYSIS DIPSTICK OB
Bilirubin, UA: NEGATIVE
Glucose, UA: NEGATIVE
Ketones, UA: NEGATIVE
Leukocytes, UA: NEGATIVE
Nitrite, UA: NEGATIVE
POC,PROTEIN,UA: NEGATIVE
Spec Grav, UA: 1.01 (ref 1.010–1.025)
Urobilinogen, UA: 0.2 E.U./dL
pH, UA: 7.5 (ref 5.0–8.0)

## 2021-04-22 NOTE — Progress Notes (Signed)
ROB: Signs and symptoms of labor discussed.  Patient has occasional contractions but nothing regular.  Discussed cervical check patient declined this week but is considering it next week.  Baby likely vertex by palpation however fetal heart tones heard higher than expected.  Recheck next week-consider ultrasound.

## 2021-04-28 ENCOUNTER — Encounter: Payer: Self-pay | Admitting: Obstetrics and Gynecology

## 2021-04-28 ENCOUNTER — Ambulatory Visit (INDEPENDENT_AMBULATORY_CARE_PROVIDER_SITE_OTHER): Payer: BC Managed Care – PPO | Admitting: Obstetrics and Gynecology

## 2021-04-28 ENCOUNTER — Other Ambulatory Visit: Payer: Self-pay

## 2021-04-28 ENCOUNTER — Other Ambulatory Visit
Admission: RE | Admit: 2021-04-28 | Discharge: 2021-04-28 | Disposition: A | Discharge status: Home / Self Care | Payer: BC Managed Care – PPO | Attending: Obstetrics and Gynecology | Admitting: Obstetrics and Gynecology

## 2021-04-28 VITALS — BP 137/90 | HR 102 | Wt 208.7 lb

## 2021-04-28 DIAGNOSIS — Z3A38 38 weeks gestation of pregnancy: Secondary | ICD-10-CM

## 2021-04-28 DIAGNOSIS — Z3A00 Weeks of gestation of pregnancy not specified: Secondary | ICD-10-CM | POA: Insufficient documentation

## 2021-04-28 DIAGNOSIS — O133 Gestational [pregnancy-induced] hypertension without significant proteinuria, third trimester: Secondary | ICD-10-CM | POA: Insufficient documentation

## 2021-04-28 DIAGNOSIS — Z3483 Encounter for supervision of other normal pregnancy, third trimester: Secondary | ICD-10-CM

## 2021-04-28 LAB — POCT URINALYSIS DIPSTICK OB
Bilirubin, UA: NEGATIVE
Glucose, UA: NEGATIVE
Ketones, UA: NEGATIVE
Leukocytes, UA: NEGATIVE
Nitrite, UA: NEGATIVE
Spec Grav, UA: 1.01 (ref 1.010–1.025)
Urobilinogen, UA: 0.2 E.U./dL
pH, UA: 8 (ref 5.0–8.0)

## 2021-04-28 LAB — COMPREHENSIVE METABOLIC PANEL
ALT: 13 U/L (ref 0–44)
AST: 18 U/L (ref 15–41)
Albumin: 3 g/dL — ABNORMAL LOW (ref 3.5–5.0)
Alkaline Phosphatase: 100 U/L (ref 38–126)
Anion gap: 7 (ref 5–15)
BUN: 13 mg/dL (ref 6–20)
CO2: 23 mmol/L (ref 22–32)
Calcium: 8.7 mg/dL — ABNORMAL LOW (ref 8.9–10.3)
Chloride: 105 mmol/L (ref 98–111)
Creatinine, Ser: 0.85 mg/dL (ref 0.44–1.00)
GFR, Estimated: >60 mL/min (ref >60)
Glucose, Bld: 91 mg/dL (ref 70–99)
Potassium: 4 mmol/L (ref 3.5–5.1)
Sodium: 135 mmol/L (ref 135–145)
Total Bilirubin: 0.4 mg/dL (ref 0.3–1.2)
Total Protein: 6.8 g/dL (ref 6.5–8.1)

## 2021-04-28 LAB — CBC
HCT: 35.9 % — ABNORMAL LOW (ref 36.0–46.0)
Hemoglobin: 12.4 g/dL (ref 12.0–15.0)
MCH: 30.5 pg (ref 26.0–34.0)
MCHC: 34.5 g/dL (ref 30.0–36.0)
MCV: 88.4 fL (ref 80.0–100.0)
Platelets: 176 10*3/uL (ref 150–400)
RBC: 4.06 MIL/uL (ref 3.87–5.11)
RDW: 12.8 % (ref 11.5–15.5)
WBC: 8.9 10*3/uL (ref 4.0–10.5)
nRBC: 0 % (ref 0.0–0.2)

## 2021-04-28 NOTE — Progress Notes (Signed)
ROB. Patient reports no concerns other than pelvic pressure.

## 2021-04-28 NOTE — Progress Notes (Signed)
ROB: Patient feeling more pelvic pressure and cramping.  BPs elevated today (mild), also noted ot be elevated on last visit.  Possibly developing GHTN. Will send for [facility] labs. Discussed IOL for GHTN. Will contact L&D to schedule.

## 2021-04-29 ENCOUNTER — Telehealth: Payer: Self-pay | Admitting: Obstetrics and Gynecology

## 2021-04-29 NOTE — Telephone Encounter (Signed)
Pt is calling in reference to her lab work that she had on yesterday and wanted to see if Dr. Valentino Saxon had a chance to look at it and would be able to let her know if she will be induced.  Pt would like to have a call back to let her know.

## 2021-04-29 NOTE — Telephone Encounter (Signed)
Called patient. To inform her that she will go to L&D on Friday @ 9:00 for NST. Labs are ok. Just want to monitor her for awhile.

## 2021-05-02 ENCOUNTER — Encounter: Payer: Self-pay | Admitting: Obstetrics and Gynecology

## 2021-05-02 ENCOUNTER — Other Ambulatory Visit: Payer: Self-pay

## 2021-05-02 ENCOUNTER — Observation Stay
Admission: RE | Admit: 2021-05-02 | Discharge: 2021-05-02 | Disposition: A | Discharge status: Home / Self Care | Payer: BC Managed Care – PPO | Source: Ambulatory Visit | Attending: Obstetrics and Gynecology | Admitting: Obstetrics and Gynecology

## 2021-05-02 DIAGNOSIS — Z3490 Encounter for supervision of normal pregnancy, unspecified, unspecified trimester: Secondary | ICD-10-CM

## 2021-05-02 DIAGNOSIS — R519 Headache, unspecified: Secondary | ICD-10-CM

## 2021-05-02 DIAGNOSIS — Z3A39 39 weeks gestation of pregnancy: Secondary | ICD-10-CM | POA: Diagnosis not present

## 2021-05-02 DIAGNOSIS — O26893 Other specified pregnancy related conditions, third trimester: Principal | ICD-10-CM

## 2021-05-02 NOTE — OB Triage Note (Signed)
Pt discharged home per order.  Pt stable and ambulatory and an After Visit Summary was printed and given to the patient. Discharge education completed with patient/family including follow up instructions, appointments, and medication list. Pt received labor, pre-e, and bleeding precautions. Patient able to verbalize understanding, all questions fully answered upon discharge. Pt discharged home via personal vehicle with support person.

## 2021-05-02 NOTE — OB Triage Note (Signed)
Pt presented to L/D for scheduled NST. Pt reports no bleeding or LOF and positive fetal movement. Pt reports no contractions. Pt reports waking up with a posterior headache, rated 2/10. She reports increased SOB the last two weeks and increased pedal edema the last few days. 1+ edema noted upon assessment. +2 reflexes, no clonus noted. No blurry vision or epigastric pain. Lungs clear. Monitors applied and assessing- initial FHT 164.  BP 123/87.

## 2021-05-04 NOTE — Discharge Summary (Signed)
    L&D OB Triage Note  SUBJECTIVE Kathy Pham is a 25 y.o. G2P0010 female at [redacted]w[redacted]d, EDD Estimated Date of Delivery: 05/06/21 who presented to triage for scheduled NST and blood pressure check.  She was seen earlier this week by Dr. Valentino Saxon in the office.  At presentation she complained of a mild headache 2/10.  OB History  Gravida Para Term Preterm AB Living  2 0 0 0 1 0  SAB IAB Ectopic Multiple Live Births  1 0 0 0 0    # Outcome Date GA Lbr Len/2nd Weight Sex Delivery Anes PTL Lv  2 Current           1 SAB 11/03/17            No medications prior to admission.     OBJECTIVE  Nursing Evaluation:   BP 123/87 (BP Location: Left Arm)   Pulse (!) 130   Temp 97.7 F (36.5 C) (Oral)   Resp 18   Ht 5\' 3"  (1.6 m)   Wt 95 kg   LMP 07/30/2020   BMI 37.10 kg/m    Findings:        Blood pressures good today.     No evidence of preeclampsia (previous lab work earlier in the week negative)     Reassuring NST      NST was performed and has been reviewed by me.  NST INTERPRETATION: Category I  Mode: External Baseline Rate (A): 155 bpm Variability: Moderate Accelerations: 15 x 15 Decelerations: Variable Nonstress Test Interpretation: Reactive Overall Impression: Reassuring for gestational age Contraction Frequency (min): occ with UI  ASSESSMENT Impression:  1.  Pregnancy:  G2P0010 at [redacted]w[redacted]d , EDD Estimated Date of Delivery: 05/06/21 2.  Reassuring fetal and maternal status   PLAN 1. Current condition and above findings reviewed.  Reassuring fetal and maternal condition. 2. Discharge home with standard labor precautions given to return to L&D or call the office for problems. 3. Continue routine prenatal care.

## 2021-05-06 ENCOUNTER — Ambulatory Visit (INDEPENDENT_AMBULATORY_CARE_PROVIDER_SITE_OTHER): Payer: BC Managed Care – PPO | Admitting: Obstetrics and Gynecology

## 2021-05-06 ENCOUNTER — Encounter: Payer: Self-pay | Admitting: Obstetrics and Gynecology

## 2021-05-06 ENCOUNTER — Other Ambulatory Visit: Payer: Self-pay

## 2021-05-06 VITALS — BP 126/87 | HR 114 | Wt 209.4 lb

## 2021-05-06 DIAGNOSIS — Z3A40 40 weeks gestation of pregnancy: Secondary | ICD-10-CM

## 2021-05-06 DIAGNOSIS — Z3483 Encounter for supervision of other normal pregnancy, third trimester: Secondary | ICD-10-CM

## 2021-05-06 LAB — POCT URINALYSIS DIPSTICK OB
Bilirubin, UA: NEGATIVE
Glucose, UA: NEGATIVE
Ketones, UA: NEGATIVE
Leukocytes, UA: NEGATIVE
Nitrite, UA: NEGATIVE
Spec Grav, UA: 1.01 (ref 1.010–1.025)
Urobilinogen, UA: 0.2 E.U./dL
pH, UA: 7.5 (ref 5.0–8.0)

## 2021-05-06 NOTE — Progress Notes (Signed)
ROB: Patient has mild headache but otherwise doing well.  Blood pressure is good today.  No current evidence of preeclampsia.  Induction for postdates scheduled. Sunday.

## 2021-05-06 NOTE — Progress Notes (Signed)
ROB. Patient presents at [redacted]w[redacted]d. Patient states she is extremely tired and ready for baby to come. Patient states experiencing contractions that come and go, cramping in lower back and pelvic pressure. Patient states having a dull headache for 3-4 days, no relief with tylenol.

## 2021-05-07 NOTE — Addendum Note (Signed)
Addended by: Elonda Husky on: 05/07/2021 08:21 AM   Modules accepted: Orders

## 2021-05-08 ENCOUNTER — Other Ambulatory Visit: Payer: Self-pay | Admitting: Obstetrics and Gynecology

## 2021-05-08 ENCOUNTER — Other Ambulatory Visit
Admission: RE | Admit: 2021-05-08 | Discharge: 2021-05-08 | Disposition: A | Discharge status: Home / Self Care | Payer: BC Managed Care – PPO | Source: Ambulatory Visit | Attending: Obstetrics and Gynecology | Admitting: Obstetrics and Gynecology

## 2021-05-08 ENCOUNTER — Other Ambulatory Visit: Payer: Self-pay

## 2021-05-08 DIAGNOSIS — Z01812 Encounter for preprocedural laboratory examination: Secondary | ICD-10-CM | POA: Insufficient documentation

## 2021-05-08 DIAGNOSIS — Z20822 Contact with and (suspected) exposure to covid-19: Secondary | ICD-10-CM | POA: Insufficient documentation

## 2021-05-08 LAB — SARS CORONAVIRUS 2 (TAT 6-24 HRS): SARS Coronavirus 2: NEGATIVE

## 2021-05-10 ENCOUNTER — Other Ambulatory Visit: Payer: Self-pay

## 2021-05-10 ENCOUNTER — Encounter: Payer: Self-pay | Admitting: Obstetrics and Gynecology

## 2021-05-10 ENCOUNTER — Inpatient Hospital Stay: Payer: BC Managed Care – PPO | Admitting: Anesthesiology

## 2021-05-10 ENCOUNTER — Inpatient Hospital Stay
Admission: EM | Admit: 2021-05-10 | Discharge: 2021-05-12 | DRG: 807 | Disposition: A | Discharge status: Home / Self Care | Payer: BC Managed Care – PPO | Attending: Obstetrics and Gynecology | Admitting: Obstetrics and Gynecology

## 2021-05-10 DIAGNOSIS — Z3A40 40 weeks gestation of pregnancy: Secondary | ICD-10-CM | POA: Diagnosis not present

## 2021-05-10 DIAGNOSIS — B951 Streptococcus, group B, as the cause of diseases classified elsewhere: Secondary | ICD-10-CM

## 2021-05-10 DIAGNOSIS — O133 Gestational [pregnancy-induced] hypertension without significant proteinuria, third trimester: Secondary | ICD-10-CM

## 2021-05-10 DIAGNOSIS — O99210 Obesity complicating pregnancy, unspecified trimester: Secondary | ICD-10-CM

## 2021-05-10 DIAGNOSIS — O480 Post-term pregnancy: Secondary | ICD-10-CM | POA: Diagnosis not present

## 2021-05-10 DIAGNOSIS — O134 Gestational [pregnancy-induced] hypertension without significant proteinuria, complicating childbirth: Secondary | ICD-10-CM | POA: Diagnosis present

## 2021-05-10 DIAGNOSIS — R8271 Bacteriuria: Secondary | ICD-10-CM

## 2021-05-10 DIAGNOSIS — Z20822 Contact with and (suspected) exposure to covid-19: Secondary | ICD-10-CM | POA: Diagnosis present

## 2021-05-10 DIAGNOSIS — Z87891 Personal history of nicotine dependence: Secondary | ICD-10-CM

## 2021-05-10 DIAGNOSIS — O324XX0 Maternal care for high head at term, not applicable or unspecified: Secondary | ICD-10-CM | POA: Diagnosis present

## 2021-05-10 DIAGNOSIS — O98813 Other maternal infectious and parasitic diseases complicating pregnancy, third trimester: Secondary | ICD-10-CM | POA: Diagnosis not present

## 2021-05-10 DIAGNOSIS — O99824 Streptococcus B carrier state complicating childbirth: Secondary | ICD-10-CM | POA: Diagnosis present

## 2021-05-10 LAB — CBC
HCT: 35.6 % — ABNORMAL LOW (ref 36.0–46.0)
Hemoglobin: 12 g/dL (ref 12.0–15.0)
MCH: 30.3 pg (ref 26.0–34.0)
MCHC: 33.7 g/dL (ref 30.0–36.0)
MCV: 89.9 fL (ref 80.0–100.0)
Platelets: 155 10*3/uL (ref 150–400)
RBC: 3.96 MIL/uL (ref 3.87–5.11)
RDW: 13.1 % (ref 11.5–15.5)
WBC: 7.6 10*3/uL (ref 4.0–10.5)
nRBC: 0 % (ref 0.0–0.2)

## 2021-05-10 LAB — ABO/RH: ABO/RH(D): B POS

## 2021-05-10 LAB — TYPE AND SCREEN
ABO/RH(D): B POS
Antibody Screen: NEGATIVE

## 2021-05-10 LAB — RPR: RPR Ser Ql: NONREACTIVE

## 2021-05-10 MED ORDER — FENTANYL-BUPIVACAINE-NACL 0.5-0.125-0.9 MG/250ML-% EP SOLN
12.0000 mL/h | EPIDURAL | Status: DC | PRN
  Administered 2021-05-10 (×2): 12 mL/h via EPIDURAL

## 2021-05-10 MED ORDER — SODIUM CHLORIDE 0.9 % IV SOLN
2.0000 g | Freq: Once | INTRAVENOUS | Status: AC
  Administered 2021-05-10: 06:00:00 2 g via INTRAVENOUS
  Filled 2021-05-10: qty 2000

## 2021-05-10 MED ORDER — EPHEDRINE 5 MG/ML INJ: 10.0000 mg | INTRAVENOUS | Status: DC | PRN

## 2021-05-10 MED ORDER — DIPHENHYDRAMINE HCL 50 MG/ML IJ SOLN: 12.5000 mg | INTRAMUSCULAR | Status: DC | PRN

## 2021-05-10 MED ORDER — AMMONIA AROMATIC IN INHA
RESPIRATORY_TRACT | Status: AC
  Filled 2021-05-10: qty 10

## 2021-05-10 MED ORDER — BUTORPHANOL TARTRATE 1 MG/ML IJ SOLN: 1.0000 mg | INTRAMUSCULAR | Status: DC | PRN

## 2021-05-10 MED ORDER — FAMOTIDINE 20 MG PO TABS
20.0000 mg | ORAL_TABLET | Freq: Two times a day (BID) | ORAL | Status: DC
  Administered 2021-05-10: 10:00:00 20 mg via ORAL
  Filled 2021-05-10: qty 1

## 2021-05-10 MED ORDER — LIDOCAINE HCL (PF) 1 % IJ SOLN
INTRAMUSCULAR | Status: DC | PRN
  Administered 2021-05-10: 5 mL
  Administered 2021-05-10: 3 mL

## 2021-05-10 MED ORDER — MISOPROSTOL 50MCG HALF TABLET
50.0000 ug | ORAL_TABLET | ORAL | Status: DC | PRN
  Administered 2021-05-10: 06:00:00 50 ug via VAGINAL
  Filled 2021-05-10: qty 1

## 2021-05-10 MED ORDER — LACTATED RINGERS IV SOLN: INTRAVENOUS | Status: DC

## 2021-05-10 MED ORDER — OXYTOCIN 10 UNIT/ML IJ SOLN
INTRAMUSCULAR | Status: AC
  Filled 2021-05-10: qty 1

## 2021-05-10 MED ORDER — OXYTOCIN-SODIUM CHLORIDE 30-0.9 UT/500ML-% IV SOLN
2.5000 [IU]/h | INTRAVENOUS | Status: DC
  Administered 2021-05-11: 2.5 [IU]/h via INTRAVENOUS
  Filled 2021-05-10: qty 500

## 2021-05-10 MED ORDER — SODIUM CHLORIDE 0.9 % IV SOLN
1.0000 g | INTRAVENOUS | Status: DC
  Administered 2021-05-10 (×4): 1 g via INTRAVENOUS
  Filled 2021-05-10 (×4): qty 1000

## 2021-05-10 MED ORDER — LACTATED RINGERS IV SOLN
500.0000 mL | Freq: Once | INTRAVENOUS | Status: AC
  Administered 2021-05-10: 13:00:00 500 mL via INTRAVENOUS

## 2021-05-10 MED ORDER — MISOPROSTOL 200 MCG PO TABS
ORAL_TABLET | ORAL | Status: AC
  Filled 2021-05-10: qty 4

## 2021-05-10 MED ORDER — ACETAMINOPHEN 325 MG PO TABS
650.0000 mg | ORAL_TABLET | ORAL | Status: DC | PRN
  Administered 2021-05-10: 650 mg via ORAL
  Filled 2021-05-10: qty 2

## 2021-05-10 MED ORDER — LIDOCAINE-EPINEPHRINE (PF) 1.5 %-1:200000 IJ SOLN
INTRAMUSCULAR | Status: DC | PRN
  Administered 2021-05-10: 1.5 mL via PERINEURAL

## 2021-05-10 MED ORDER — SOD CITRATE-CITRIC ACID 500-334 MG/5ML PO SOLN: 30.0000 mL | ORAL | Status: DC | PRN

## 2021-05-10 MED ORDER — BUTORPHANOL TARTRATE 1 MG/ML IJ SOLN
1.0000 mg | INTRAMUSCULAR | Status: DC | PRN
  Administered 2021-05-10: 11:00:00 1 mg via INTRAVENOUS
  Filled 2021-05-10: qty 1

## 2021-05-10 MED ORDER — OXYTOCIN-SODIUM CHLORIDE 30-0.9 UT/500ML-% IV SOLN
1.0000 m[IU]/min | INTRAVENOUS | Status: DC
  Administered 2021-05-10: 11:00:00 4 m[IU]/min via INTRAVENOUS
  Administered 2021-05-11: 2 m[IU]/min via INTRAVENOUS

## 2021-05-10 MED ORDER — ONDANSETRON HCL 4 MG/2ML IJ SOLN
4.0000 mg | Freq: Four times a day (QID) | INTRAMUSCULAR | Status: DC | PRN
  Administered 2021-05-10: 22:00:00 4 mg via INTRAVENOUS
  Filled 2021-05-10: qty 2

## 2021-05-10 MED ORDER — PHENYLEPHRINE 40 MCG/ML (10ML) SYRINGE FOR IV PUSH (FOR BLOOD PRESSURE SUPPORT): 80.0000 ug | PREFILLED_SYRINGE | INTRAVENOUS | Status: DC | PRN

## 2021-05-10 MED ORDER — LIDOCAINE HCL (PF) 1 % IJ SOLN
30.0000 mL | INTRAMUSCULAR | Status: DC | PRN
  Filled 2021-05-10: qty 30

## 2021-05-10 MED ORDER — OXYTOCIN BOLUS FROM INFUSION
333.0000 mL | Freq: Once | INTRAVENOUS | Status: AC
  Administered 2021-05-11: 333 mL via INTRAVENOUS

## 2021-05-10 MED ORDER — LACTATED RINGERS IV SOLN
500.0000 mL | INTRAVENOUS | Status: DC | PRN
  Administered 2021-05-10: 14:00:00 250 mL via INTRAVENOUS
  Administered 2021-05-10: 22:00:00 500 mL via INTRAVENOUS

## 2021-05-10 MED ORDER — FENTANYL-BUPIVACAINE-NACL 0.5-0.125-0.9 MG/250ML-% EP SOLN
EPIDURAL | Status: AC
  Filled 2021-05-10: qty 250

## 2021-05-10 NOTE — H&P (Addendum)
Obstetric History and Physical  Kathy Pham is a 25 y.o. G2P0010 with IUP at [redacted]w[redacted]d presenting for scheduled IOL for postdates pregnancy. Also with gestational HTN at term.  Patient states she has been having  occasional contractions, none vaginal bleeding, intact membranes, with active fetal movement.    Prenatal Course Source of Care: [facility] with onset of care at 9 weeks Pregnancy complications or risks: Patient Active Problem List   Diagnosis Date Noted   Post-dates pregnancy 05/10/2021   Pregnancy 05/02/2021   Group B streptococcal bacteriuria 11/19/2020   Acute pharyngitis 10/22/2020   Encounter for routine child health examination without abnormal findings 10/22/2020   Awareness of heartbeats 10/22/2020   Epistaxis 10/22/2020   Fatigue 10/22/2020   Infection of the upper respiratory tract 10/22/2020   LBP (low back pain) 10/22/2020   Received influenza vaccination at hospital 10/22/2020   Irregular menses 01/10/2016   Episodic tension type headache 04/06/2013   Anxiety state, unspecified 01/25/2013   Depression 01/24/2013   Migraine without aura, without mention of intractable migraine without mention of status migrainosus 01/24/2013   Generalized abdominal pain    Vomiting    Nausea    She plans to breastfeed She desires  umdecided method  for postpartum contraception.   Prenatal labs and studies: ABO, Rh: B/Positive/-- (04/29 1406) Antibody: Negative (04/29 1406) Rubella: 1.78 (04/29 1406) RPR: Non Reactive (09/19 1001)  HBsAg: Negative (04/29 1406)  HIV: Non Reactive (04/29 1406)  NGE:XBMWUXLK/-- (11/10 1444) 1 hr Glucola  normal Genetic screening normal Anatomy US normal   Past Medical History:  Diagnosis Date   Abdominal pain, recurrent    Dysmenorrhea    Headache(784.0)    Menorrhagia    Nausea    Vomiting     Past Surgical History:  Procedure Laterality Date   ESOPHAGOGASTRODUODENOSCOPY  08/20/2011   Procedure:  ESOPHAGOGASTRODUODENOSCOPY (EGD);  Surgeon: Jon Gills, MD;  Location: [facility] OR;  Service: Gastroenterology;  Laterality: N/A;    OB History  Gravida Para Term Preterm AB Living  2 0 0 0 1 0  SAB IAB Ectopic Multiple Live Births  1 0 0 0 0    # Outcome Date GA Lbr Len/2nd Weight Sex Delivery Anes PTL Lv  2 Current           1 SAB 11/03/17            Social History   Socioeconomic History   Marital status: Single    Spouse name: Not on file   Number of children: Not on file   Years of education: Not on file   Highest education level: Not on file  Occupational History   Not on file  Tobacco Use   Smoking status: Former    Packs/day: 0.25    Types: Cigarettes    Quit date: 04/09/2019    Years since quitting: 2.0   Smokeless tobacco: Never  Vaping Use   Vaping Use: Never used  Substance and Sexual Activity   Alcohol use: Not Currently   Drug use: Never   Sexual activity: Yes    Partners: Male    Birth control/protection: None  Other Topics Concern   Not on file  Social History Narrative   12th grade   Social Determinants of Health   Financial Resource Strain: Not on file  Food Insecurity: Not on file  Transportation Needs: Not on file  Physical Activity: Not on file  Stress: Not on file  Social Connections: Not on  file    Family History  Problem Relation Age of Onset   GER disease Father    Diabetes Mother    Diabetes Maternal Grandfather    Diabetes Paternal Grandmother    Cervical cancer Paternal Grandmother    Breast cancer Neg Hx    Ovarian cancer Neg Hx    Colon cancer Neg Hx     Medications Prior to Admission  Medication Sig Dispense Refill Last Dose   hydrOXYzine (ATARAX/VISTARIL) 25 MG tablet Take 1 tablet (25 mg total) by mouth every 6 (six) hours as needed for itching. 30 tablet 0    Prenatal Vit-Fe Fumarate-FA (MULTIVITAMIN-PRENATAL) 27-0.8 MG TABS tablet Take 1 tablet by mouth daily at 12 noon.       No Known Allergies  Review of  Systems: Negative except for what is mentioned in HPI.  Physical Exam: BP 135/89   Pulse 86   Temp 98 F (36.7 C) (Oral)   Resp 18   Ht 5\' 3"  (1.6 m)   Wt 95 kg   LMP 07/30/2020   SpO2 100%   BMI 37.09 kg/m  CONSTITUTIONAL: Well-developed, well-nourished female in no acute distress.  HENT:  Normocephalic, atraumatic, External right and left ear normal. Oropharynx is clear and moist EYES: Conjunctivae and EOM are normal. Pupils are equal, round, and reactive to light. No scleral icterus.  NECK: Normal range of motion, supple, no masses SKIN: Skin is warm and dry. No rash noted. Not diaphoretic. No erythema. No pallor. NEUROLOGIC: Alert and oriented to person, place, and time. Normal reflexes, muscle tone coordination. No cranial nerve deficit noted. PSYCHIATRIC: Normal mood and affect. Normal behavior. Normal judgment and thought content. CARDIOVASCULAR: Normal heart rate noted, regular rhythm RESPIRATORY: Effort and breath sounds normal, no problems with respiration noted ABDOMEN: Soft, nontender, nondistended, gravid. MUSCULOSKELETAL: Normal range of motion. No edema and no tenderness. 2+ distal pulses.  Cervical Exam: Dilatation 3 cm   Effacement 70 %   Station -3   Presentation: cephalic FHT:  Baseline rate 140 bpm   Variability moderate  Accelerations present   Decelerations none Contractions: Uterine irritability with intermittent contractions q 2-7 minutes   Pertinent Labs/Studies:   Results for orders placed or performed during the hospital encounter of 05/10/21 (from the past 24 hour(s))  CBC     Status: Abnormal   Collection Time: 05/10/21  5:53 AM  Result Value Ref Range   WBC 7.6 4.0 - 10.5 K/uL   RBC 3.96 3.87 - 5.11 MIL/uL   Hemoglobin 12.0 12.0 - 15.0 g/dL   HCT 14/04/22 (L) 73.7 - 10.6 %   MCV 89.9 80.0 - 100.0 fL   MCH 30.3 26.0 - 34.0 pg   MCHC 33.7 30.0 - 36.0 g/dL   RDW 26.9 48.5 - 46.2 %   Platelets 155 150 - 400 K/uL   nRBC 0.0 0.0 - 0.2 %  Type and  screen     Status: None   Collection Time: 05/10/21  5:53 AM  Result Value Ref Range   ABO/RH(D) B POS    Antibody Screen NEG    Sample Expiration      05/13/2021,2359 Performed at [facility], [address], [place], [place]   ABO/Rh     Status: None   Collection Time: 05/10/21  6:54 AM  Result Value Ref Range   ABO/RH(D)      B POS Performed at [facility], [address] [place], [place]  Assessment : Kathy Pham is a 25 y.o. G2P0010 at [redacted]w[redacted]d being admitted for induction of labor due to postdates pregnancy and gestational HTN at term.  GBS positive  Plan: Labor: Induction as ordered with Cytotec as per protocol. Analgesia as needed. Monitor BPs and treat for any severe ranges with Labetalol.  FWB: Reassuring fetal heart tracing.  GBS positive. Will treat with Ampicillin Delivery plan: Hopeful for vaginal delivery    Hildred Laser, MD [facility]

## 2021-05-10 NOTE — Progress Notes (Signed)
Intrapartum Progress Note  S: Patient feeling contractions.   O: Blood pressure 135/89, pulse 86, temperature 98 F (36.7 C), temperature source Oral, resp. rate 18, height 5\' 3"  (1.6 m), weight 95 kg, last menstrual period 07/30/2020, SpO2 100 %, unknown if currently breastfeeding. Gen App: NAD, comfortable Abdomen: soft, gravid FHT: baseline 140 bpm.  Accels present.  Decels absent. moderate in degree variability.   Tocometer: contractions q 3-4 minutes Cervix: 3/70/-2 Extremities: Nontender, no edema.  Pitocin: None  Labs:    Assessment:  1: SIUP at [redacted]w[redacted]d 2. GHTN  Plan:  1. Continue IOL, will start Pitocin.  2. Patient able to ambulate.  3. Continue to monitor HTN.    [redacted]w[redacted]d, MD 05/10/2021 10:24 AM

## 2021-05-10 NOTE — Anesthesia Preprocedure Evaluation (Addendum)
Anesthesia Evaluation   Patient awake    Reviewed: Allergy & Precautions, NPO status , Patient's Chart, lab work & pertinent test results  History of Anesthesia Complications Negative for: history of anesthetic complications  Airway Mallampati: I  TM Distance: >3 FB Neck ROM: Full    Dental no notable dental hx.    Pulmonary neg pulmonary ROS, former smoker,    Pulmonary exam normal breath sounds clear to auscultation       Cardiovascular Exercise Tolerance: Good METS: 3 - Mets negative cardio ROS Normal cardiovascular exam Rhythm:Regular Rate:Normal     Neuro/Psych  Headaches, PSYCHIATRIC DISORDERS    GI/Hepatic negative GI ROS, Neg liver ROS,   Endo/Other  Hypothyroidism   Renal/GU negative Renal ROS  negative genitourinary   Musculoskeletal negative musculoskeletal ROS (+)   Abdominal   Peds  Hematology negative hematology ROS (+)   Anesthesia Other Findings   Reproductive/Obstetrics (+) Pregnancy (Healthy Term pregnancy)                            Anesthesia Physical Anesthesia Plan  ASA: 2  Anesthesia Plan: Epidural   Post-op Pain Management:    Induction:   PONV Risk Score and Plan:   Airway Management Planned: Natural Airway  Additional Equipment:   Intra-op Plan:   Post-operative Plan:   Informed Consent: I have reviewed the patients History and Physical, chart, labs and discussed the procedure including the risks, benefits and alternatives for the proposed anesthesia with the patient or authorized representative who has indicated his/her understanding and acceptance.     Dental Advisory Given  Plan Discussed with:   Anesthesia Plan Comments: (Patient reports no bleeding problems and no anticoagulant use.   Patient consented for risks of anesthesia including but not limited to:  - adverse reactions to medications - risk of bleeding, infection and or nerve  damage from epidural that could lead to paralysis - risk of headache or failed epidural - nerve damage due to positioning - that if epidural is used for C-section that there is a chance of epidural failure requiring spinal placement or conversion to GA - Damage to heart, brain, lungs, other parts of body or loss of life  Patient voiced understanding.)       Anesthesia Quick Evaluation

## 2021-05-10 NOTE — Progress Notes (Signed)
Intrapartum Progress Note  S: Patient beginning to feel pressure.   O: Blood pressure 128/69, pulse (!) 114, temperature 99.5 F (37.5 C), temperature source Oral, resp. rate 18, height 5\' 3"  (1.6 m), weight 95 kg, last menstrual period 07/30/2020, SpO2 100 %, unknown if currently breastfeeding. Gen App: NAD, comfortable Abdomen: soft, gravid FHT: baseline 135 bpm.  Accels present.  Decels absent. moderate in degree variability.   Tocometer: contractions q 1-2 minutes Cervix: 10/100/-1 to 0 Extremities: Nontender, no edema.  Pitocin: Pitocin held for hyperstimulation  Labs:  No new labs  Assessment:  1: SIUP at [redacted]w[redacted]d 2. GHTN 3. GBS positive  Plan:  1. Will anticipate vaginal delivery soon 2. Continue to monitor BPs.  Occasional borderline elevated BP noted.  3. Continue ampicillin for GBS status    [redacted]w[redacted]d, MD 05/10/2021 7:54 PM

## 2021-05-10 NOTE — Anesthesia Procedure Notes (Signed)
Epidural Patient location during procedure: OB Start time: 05/10/2021 1:05 PM  Staffing Anesthesiologist: Felicita Gage, MD Performed: anesthesiologist   Preanesthetic Checklist Completed: patient identified, IV checked, site marked, risks and benefits discussed, surgical consent, monitors and equipment checked, pre-op evaluation and timeout performed  Epidural Patient position: sitting Prep: ChloraPrep Patient monitoring: heart rate, continuous pulse ox and blood pressure Approach: midline Location: L2-L3 Injection technique: LOR saline  Needle:  Needle type: Tuohy  Needle gauge: 17 G Needle length: 9 cm Needle insertion depth: 5.5 cm Catheter type: closed end flexible Catheter size: 20 Guage Catheter at skin depth: 12 cm Test dose: negative and 1.5% lidocaine with Epi 1:200 K  Assessment Sensory level: T10 Events: blood not aspirated, injection not painful, no injection resistance and negative IV test  Additional Notes  1 attempt Pt. Evaluated and documentation done after procedure finished. Patient identified. Risks/Benefits/Options discussed with patient including but not limited to bleeding, infection, nerve damage, paralysis, failed block, incomplete pain control, headache, blood pressure changes, nausea, vomiting, reactions to medication both or allergic, itching and postpartum back pain. Confirmed with bedside nurse the patient's most recent platelet count. Confirmed with patient that they are not currently taking any anticoagulation, have any bleeding history or any family history of bleeding disorders. Patient expressed understanding and wished to proceed. All questions were answered. Sterile technique was used throughout the entire procedure. Please see nursing notes for vital signs. Test dose was given through epidural catheter and negative prior to continuing to dose epidural or start infusion. Warning signs of high block given to the patient including shortness of  breath, tingling/numbness in hands, complete motor block, or any concerning symptoms with instructions to call for help. Patient was given instructions on fall risk and not to get out of bed. All questions and concerns addressed with instructions to call with any issues or inadequate analgesia.    Reason for block:at surgeon's request and procedure for pain

## 2021-05-10 NOTE — Progress Notes (Addendum)
Intrapartum Progress Note  S: Patient noting more pressure  O: Blood pressure 116/75, pulse (!) 112, temperature 100.3 F (37.9 C), temperature source Oral, resp. rate 18, height 5\' 3"  (1.6 m), weight 95 kg, last menstrual period 07/30/2020, SpO2 100 %, unknown if currently breastfeeding. Gen App: NAD, comfortable Abdomen: soft, gravid FHT: baseline 170s bpm.  Accels present.  Decels present - occasional variable deceleration . Moderate in degree variability.   Tocometer: contractions q 1-3 minutes Cervix: 10/100/+1 to +2. Also some mild labial swelling noted Extremities: Nontender, trace edema.  Pitocin: Pitocin held for hyperstimulation  Labs:  No new labs  Assessment:  1: SIUP at [redacted]w[redacted]d 2. GHTN 3. GBS positive  Plan:  1. Will anticipate vaginal delivery soon 2. Continue to monitor BPs.    3. Patient with mild fetal tachycardia and borderline temperatures.  Concerning for developing chorioamnionitis. Continue ampicillin for GBS status. Tylenol given.  4. Attempted practice pushing with modest descent noted, patient with moderate effort but appears fatigued. Will continue to allow to labor down for short period of time so that she can rest and improve pushing efforts.     [redacted]w[redacted]d, MD 05/10/2021 10:53 PM

## 2021-05-10 NOTE — Progress Notes (Signed)
Intrapartum Progress Note  S: Patient comfortable, recently had epidural placed.   O: Blood pressure (!) 93/46, pulse 92, temperature 98 F (36.7 C), temperature source Oral, resp. rate 17, height 5\' 3"  (1.6 m), weight 95 kg, last menstrual period 07/30/2020, SpO2 99 %, unknown if currently breastfeeding. Gen App: NAD, comfortable Abdomen: soft, gravid FHT: baseline 140 bpm.  Accels present.  Decels present - intermittent variables and lates . Moderate in degree variability.   Tocometer: contractions irregular, q 2-4 minutes Cervix: 4/80-90/-2 Extremities: Nontender, no edema.  Pitocin: Held for decelerations  Labs:  No new labs   Assessment:  1: SIUP at [redacted]w[redacted]d 2. GHTN 3. Category II tracing, improving  Plan:  1. Fetal tracing more reassuring now with position changes, fluid bolus, and holding of Pitocin 2. Patient able to ambulate.  3. Continue to monitor HTN. Previously labile, now hypotensive after epidural.    [redacted]w[redacted]d, MD 05/10/2021 2:42 PM

## 2021-05-11 ENCOUNTER — Encounter: Payer: Self-pay | Admitting: Obstetrics and Gynecology

## 2021-05-11 DIAGNOSIS — O134 Gestational [pregnancy-induced] hypertension without significant proteinuria, complicating childbirth: Secondary | ICD-10-CM

## 2021-05-11 MED ORDER — WITCH HAZEL-GLYCERIN EX PADS
1.0000 "application " | MEDICATED_PAD | CUTANEOUS | Status: DC | PRN
  Administered 2021-05-11: 1 via TOPICAL
  Filled 2021-05-11 (×2): qty 100

## 2021-05-11 MED ORDER — DIPHENHYDRAMINE HCL 25 MG PO CAPS: 25.0000 mg | ORAL_CAPSULE | Freq: Four times a day (QID) | ORAL | Status: DC | PRN

## 2021-05-11 MED ORDER — DIBUCAINE (PERIANAL) 1 % EX OINT
1.0000 "application " | TOPICAL_OINTMENT | CUTANEOUS | Status: DC | PRN
  Filled 2021-05-11: qty 28

## 2021-05-11 MED ORDER — BENZOCAINE-MENTHOL 20-0.5 % EX AERO
1.0000 "application " | INHALATION_SPRAY | CUTANEOUS | Status: DC | PRN
  Administered 2021-05-11: 1 via TOPICAL
  Filled 2021-05-11 (×3): qty 56

## 2021-05-11 MED ORDER — FERROUS SULFATE 325 (65 FE) MG PO TABS
325.0000 mg | ORAL_TABLET | Freq: Every day | ORAL | Status: DC
  Administered 2021-05-11 – 2021-05-12 (×2): 325 mg via ORAL
  Filled 2021-05-11 (×2): qty 1

## 2021-05-11 MED ORDER — ONDANSETRON HCL 4 MG PO TABS
4.0000 mg | ORAL_TABLET | ORAL | Status: DC | PRN
  Filled 2021-05-11: qty 1

## 2021-05-11 MED ORDER — COCONUT OIL OIL
1.0000 "application " | TOPICAL_OIL | Status: DC | PRN
  Filled 2021-05-11 (×2): qty 120

## 2021-05-11 MED ORDER — ONDANSETRON HCL 4 MG/2ML IJ SOLN: 4.0000 mg | INTRAMUSCULAR | Status: DC | PRN

## 2021-05-11 MED ORDER — SENNOSIDES-DOCUSATE SODIUM 8.6-50 MG PO TABS
2.0000 | ORAL_TABLET | Freq: Every day | ORAL | Status: DC
  Administered 2021-05-12: 2 via ORAL
  Filled 2021-05-11: qty 2

## 2021-05-11 MED ORDER — ZOLPIDEM TARTRATE 5 MG PO TABS: 5.0000 mg | ORAL_TABLET | Freq: Every evening | ORAL | Status: DC | PRN

## 2021-05-11 MED ORDER — IBUPROFEN 600 MG PO TABS
600.0000 mg | ORAL_TABLET | Freq: Four times a day (QID) | ORAL | Status: DC
  Administered 2021-05-11 – 2021-05-12 (×6): 600 mg via ORAL
  Filled 2021-05-11 (×6): qty 1

## 2021-05-11 MED ORDER — ACETAMINOPHEN 325 MG PO TABS
650.0000 mg | ORAL_TABLET | ORAL | Status: DC | PRN
  Administered 2021-05-11 (×3): 650 mg via ORAL
  Filled 2021-05-11 (×3): qty 2

## 2021-05-11 MED ORDER — PRENATAL MULTIVITAMIN CH
1.0000 | ORAL_TABLET | Freq: Every day | ORAL | Status: DC
  Administered 2021-05-11 – 2021-05-12 (×2): 1 via ORAL
  Filled 2021-05-11 (×2): qty 1

## 2021-05-11 MED ORDER — SIMETHICONE 80 MG PO CHEW: 80.0000 mg | CHEWABLE_TABLET | ORAL | Status: DC | PRN

## 2021-05-11 NOTE — Progress Notes (Signed)
Verbal consent given to [facility] MD to do a vacuum extraction.   First applied at 0139 Pulled at 0143 for 30 seconds Pulled at 0145 for 30 seconds Pulled at 0147 for 30 seconds Pulled at 0149 for 30 seconds Pulled at 0152 for 30 seconds Pulled at 0154 for 30 seconds Pulled at 0158 for 40 seconds  Pop off at 0159 Applied at 0200 Pulled at 0200 for 30 seconds Pulled at 0201 for 20 seconds  Pop off at 0201  Reapplied at 0215 Pulled at 0216 for 40 seconds Pulled at 0217 for 40 seconds Pulled at 0219 for 20 seconds  Removed at 0220  Baby delivered vaginally with vacuum assistance at 0220.

## 2021-05-12 LAB — CBC
HCT: 29.9 % — ABNORMAL LOW (ref 36.0–46.0)
Hemoglobin: 9.9 g/dL — ABNORMAL LOW (ref 12.0–15.0)
MCH: 29.7 pg (ref 26.0–34.0)
MCHC: 33.1 g/dL (ref 30.0–36.0)
MCV: 89.8 fL (ref 80.0–100.0)
Platelets: 138 10*3/uL — ABNORMAL LOW (ref 150–400)
RBC: 3.33 MIL/uL — ABNORMAL LOW (ref 3.87–5.11)
RDW: 13.8 % (ref 11.5–15.5)
WBC: 8.7 10*3/uL (ref 4.0–10.5)
nRBC: 0 % (ref 0.0–0.2)

## 2021-05-12 MED ORDER — FUROSEMIDE 20 MG PO TABS: 20.0000 mg | ORAL_TABLET | Freq: Every day | ORAL | 0 refills | Status: DC

## 2021-05-12 MED ORDER — IBUPROFEN 800 MG PO TABS: 800.0000 mg | ORAL_TABLET | Freq: Three times a day (TID) | ORAL | 1 refills | Status: DC | PRN

## 2021-05-12 MED ORDER — FUROSEMIDE 20 MG PO TABS
20.0000 mg | ORAL_TABLET | Freq: Every day | ORAL | Status: DC
  Administered 2021-05-12: 20 mg via ORAL
  Filled 2021-05-12 (×2): qty 1

## 2021-05-12 MED ORDER — OXYCODONE-ACETAMINOPHEN 5-325 MG PO TABS
1.0000 | ORAL_TABLET | Freq: Four times a day (QID) | ORAL | Status: DC | PRN
Start: 2021-05-12 — End: 2021-05-12

## 2021-05-12 NOTE — Anesthesia Postprocedure Evaluation (Signed)
Anesthesia Post Note  Patient: GREENLEE ANCHETA  Procedure(s) Performed: AN AD HOC LABOR EPIDURAL  Patient location during evaluation: Mother Baby Anesthesia Type: Epidural Level of consciousness: awake and alert Pain management: pain level controlled Vital Signs Assessment: post-procedure vital signs reviewed and stable Respiratory status: spontaneous breathing, nonlabored ventilation and respiratory function stable Cardiovascular status: stable Postop Assessment: no headache, no backache and epidural receding Anesthetic complications: no   No notable events documented.   Last Vitals:  Vitals:   05/12/21 0002 05/12/21 0415  BP: 133/87 111/74  Pulse: 90 89  Resp: 18 20  Temp: 36.6 C (!) 36.4 C  SpO2: 96% 98%    Last Pain:  Vitals:   05/12/21 0415  TempSrc: Oral  PainSc:                  Rosanne Gutting

## 2021-05-12 NOTE — Lactation Note (Signed)
This note was copied from a baby's chart. Lactation student met with patient at bedside. Patient reports that baby "doesn't like to come to my breast". She states she thinks she will continue pumping and bottle feeding because as long as he gets breast milk, she is happy. Student offered praise for her choices and shared that she could continue to offer the breast before each pumping session. Patient has a Motif breast pump at home. Student informed patient to feed baby on demand and follow feeding cues.   Student reviewed the following:  Pump Education:  Pump a minimum 8-12x/day;  Disassemble all pump parts to wash/air dry completely;  Flange size may change throughout journey  Safe Milk Storage:  Clean milk storage bottles w/tight lid  Milk bags (let out all air, store flat) 4 hours room temp 4 days refrigerator (no doors, in back) 6 months standard freezer (no doors)  12 months deep freezer  Patient has been using hospital pump to express colostrum and bottle feed baby. She has noticed her right breast feeling more sore, but has not noticed any other changes with her milk. Patient educated on changes with breasts and milk to expect in coming days.   Encouraged to contact Lactation with any questions, concerns, or requests for outpatient support.

## 2021-05-12 NOTE — Progress Notes (Signed)
Patient discharged home with family.  Discharge instructions, when to follow up, and prescriptions reviewed with patient.  Patient verbalized understanding. Patient will be escorted out by auxiliary.   

## 2021-05-12 NOTE — Progress Notes (Signed)
Post Partum Day # 1, s/p VAVD  Subjective: up ad lib, voiding, and tolerating PO.  Notes that pain is sometimes not controlled with Ibuprofen and Tylenol (pain at epidural site, vaginal discomfort, etch).  Also notes pain with use of breast pump. Bleeding is well controlled.   Objective: Temp:  [97.5 F (36.4 C)-98 F (36.7 C)] 97.5 F (36.4 C) (12/06 0415) Pulse Rate:  [82-90] 89 (12/06 0415) Resp:  [18-20] 20 (12/06 0415) BP: (111-133)/(74-89) 111/74 (12/06 0415) SpO2:  [96 %-100 %] 98 % (12/06 0415)  Physical Exam:  General: alert and no distress  Lungs: clear to auscultation bilaterally Breasts: normal appearance, no masses or tenderness Heart: regular rate and rhythm, S1, S2 normal, no murmur, click, rub or gallop Abdomen: soft, non-tender; bowel sounds normal; no masses,  no organomegaly Pelvis: Lochia: appropriate, Uterine Fundus: firm Extremities: DVT Evaluation: No evidence of DVT seen on physical exam. Negative Homan's sign. No cords or calf tenderness.  +1 pitting edema in legs bilaterally to calves.   Recent Labs    05/10/21 0553 05/12/21 0229  HGB 12.0 9.9*  HCT 35.6* 29.9*    Assessment/Plan: Overall well postpartum Will prescribe dose of Percocet for pain Breastfeeding, Lactation consult,  Contraception undecided GHTN, BPs well controlled, no meds ordered Will give dose of Lasix for swelling   LOS: 2 days   Hildred Laser, MD [facility] 05/12/2021 8:19 AM

## 2021-05-12 NOTE — Discharge Summary (Signed)
Postpartum Discharge Summary   Patient Name: Kathy Pham DOB: 01/13/1996 MRN: [id number]  Date of admission: 05/10/2021 Delivery date:05/11/2021  Delivering provider: Rubie Maid  Date of discharge: 05/12/2021  Admitting diagnosis: Post-dates pregnancy [O48.0] Intrauterine pregnancy: [redacted]w[redacted]d    Secondary diagnosis:  Principal Problem:   Post-dates pregnancy Active Problems:   Group B streptococcal bacteriuria  Additional problems: Gestational HTN    Discharge diagnosis: Term Pregnancy Delivered and Gestational Hypertension                                              Post partum procedures: None Augmentation: Pitocin and Cytotec Complications: None  Hospital course: Induction of Labor With Vaginal Delivery   25y.o. yo G2P1011 at 428w5das admitted to the hospital 05/10/2021 for induction of labor.  Indication for induction: Postdates and Gestational hypertension.  Patient had an uncomplicated labor course as follows: Membrane Rupture Time/Date: 10:45 AM ,05/10/2021   Delivery Method:Vaginal, Vacuum (Extractor)  Episiotomy: None  Lacerations:  1st degree  Details of delivery can be found in separate delivery note.  Patient had a routine postpartum course. Patient is discharged home 05/13/21.  Newborn Data: Birth date:05/11/2021  Birth time:2:20 AM  Gender:Female  Living status:Living  Apgars:7 ,9  Weight:8 lb 8.9 oz (3.88 kg)   Magnesium Sulfate received: No BMZ received: No Rhophylac:No MMR:No T-DaP:Given prenatally Flu: Given prenatally Transfusion:No  Physical exam  Vitals:   05/11/21 2120 05/12/21 0002 05/12/21 0415 05/12/21 0835  BP: 117/77 133/87 111/74 133/81  Pulse: 87 90 89 84  Resp: '18 18 20 20  ' Temp: 97.6 F (36.4 C) 97.8 F (36.6 C) (!) 97.5 F (36.4 C) 98.6 F (37 C)  TempSrc: Oral Oral Oral Oral  SpO2: 97% 96% 98% 99%  Weight:      Height:       General: alert, cooperative, and no distress Lochia: appropriate Uterine Fundus:  firm Incision: N/A DVT Evaluation: No evidence of DVT seen on physical exam. Negative Homan's sign. No cords or calf tenderness. No significant calf/ankle edema.  Labs: Lab Results  Component Value Date   WBC 8.7 05/12/2021   HGB 9.9 (L) 05/12/2021   HCT 29.9 (L) 05/12/2021   MCV 89.8 05/12/2021   PLT 138 (L) 05/12/2021   CMP Latest Ref Rng & Units 04/28/2021  Glucose 70 - 99 mg/dL 91  BUN 6 - 20 mg/dL 13  Creatinine 0.44 - 1.00 mg/dL 0.85  Sodium 135 - 145 mmol/L 135  Potassium 3.5 - 5.1 mmol/L 4.0  Chloride 98 - 111 mmol/L 105  CO2 22 - 32 mmol/L 23  Calcium 8.9 - 10.3 mg/dL 8.7(L)  Total Protein 6.5 - 8.1 g/dL 6.8  Total Bilirubin 0.3 - 1.2 mg/dL 0.4  Alkaline Phos 38 - 126 U/L 100  AST 15 - 41 U/L 18  ALT 0 - 44 U/L 13   Edinburgh Score: Edinburgh Postnatal Depression Scale Screening Tool 05/11/2021  I have been able to laugh and see the funny side of things. 0  I have looked forward with enjoyment to things. 0  I have blamed myself unnecessarily when things went wrong. 1  I have been anxious or worried for no good reason. 1  I have felt scared or panicky for no good reason. 1  Things have been getting on top of me. 1  I have  been so unhappy that I have had difficulty sleeping. 1  I have felt sad or miserable. 1  I have been so unhappy that I have been crying. 1  The thought of harming myself has occurred to me. 0  Edinburgh Postnatal Depression Scale Total 7    After visit meds:  Allergies as of 05/12/2021   No Known Allergies      Medication List     STOP taking these medications    hydrOXYzine 25 MG tablet Commonly known as: ATARAX       TAKE these medications    furosemide 20 MG tablet Commonly known as: LASIX Take 1 tablet (20 mg total) by mouth daily. For leg swelling Start taking on: May 13, 2021   ibuprofen 800 MG tablet Commonly known as: ADVIL Take 1 tablet (800 mg total) by mouth every 8 (eight) hours as needed.    multivitamin-prenatal 27-0.8 MG Tabs tablet Take 1 tablet by mouth daily at 12 noon.         Discharge home in stable condition Infant Feeding: Breast Infant Disposition:home with mother Discharge instruction: per After Visit Summary and Postpartum booklet. Activity: Advance as tolerated. Pelvic rest for 6 weeks.  Diet: routine diet Anticipated Birth Control: Unsure Postpartum Appointment:6 weeks Additional Postpartum F/U: Postpartum Depression checkup Future Appointments:No future appointments. Follow up Visit:  Follow-up Information     Rubie Maid, MD Follow up.   Specialties: Obstetrics and Gynecology, Radiology Why: 5-7 days BP check in office 2 week postpartum mood televisit 6 week postpartum visit Contact information: [address] [place] [id number] [phone]                     05/12/2021 Rubie Maid, MD

## 2021-05-12 NOTE — Discharge Instructions (Signed)

## 2021-05-19 ENCOUNTER — Ambulatory Visit (INDEPENDENT_AMBULATORY_CARE_PROVIDER_SITE_OTHER): Payer: BC Managed Care – PPO | Admitting: Obstetrics and Gynecology

## 2021-05-19 ENCOUNTER — Other Ambulatory Visit: Payer: Self-pay

## 2021-05-19 ENCOUNTER — Encounter: Payer: Self-pay | Admitting: Obstetrics and Gynecology

## 2021-05-19 DIAGNOSIS — Z0130 Encounter for examination of blood pressure without abnormal findings: Secondary | ICD-10-CM

## 2021-05-19 NOTE — Progress Notes (Signed)
HPI:      [name] is a 25 y.o. G2P1011 who LMP was No LMP recorded.  Subjective:   She presents today approximately a week postpartum.  She is here for a blood pressure check.  She is taking antihypertensive medications. She is breast pumping and occasional breast-feeding.  She has plans to call the lactation consultant to get an appointment to try to get the baby to latch more frequently. She did have can some concerns about the shape and discoloration of her abdomen.    Hx: The following portions of the patient's history were reviewed and updated as appropriate:             She  has a past medical history of Abdominal pain, recurrent, Dysmenorrhea, Headache(784.0), Menorrhagia, Nausea, and Vomiting. She does not have any pertinent problems on file. She  has a past surgical history that includes Esophagogastroduodenoscopy (08/20/2011). Her family history includes Cervical cancer in her paternal grandmother; Diabetes in her maternal grandfather, mother, and paternal grandmother; GER disease in her father. She  reports that she quit smoking about 2 years ago. Her smoking use included cigarettes. She smoked an average of .25 packs per day. She has never used smokeless tobacco. She reports that she does not currently use alcohol. She reports that she does not use drugs. She has a current medication list which includes the following prescription(s): docusate sodium, furosemide, ibuprofen, and multivitamin-prenatal. She has No Known Allergies.       Review of Systems:  Review of Systems  Constitutional: Denied constitutional symptoms, night sweats, recent illness, fatigue, fever, insomnia and weight loss.  Eyes: Denied eye symptoms, eye pain, photophobia, vision change and visual disturbance.  Ears/Nose/Throat/Neck: Denied ear, nose, throat or neck symptoms, hearing loss, nasal discharge, sinus congestion and sore throat.  Cardiovascular: Denied cardiovascular symptoms, arrhythmia,  chest pain/pressure, edema, exercise intolerance, orthopnea and palpitations.  Respiratory: Denied pulmonary symptoms, asthma, pleuritic pain, productive sputum, cough, dyspnea and wheezing.  Gastrointestinal: Denied, gastro-esophageal reflux, melena, nausea and vomiting.  Genitourinary: Denied genitourinary symptoms including symptomatic vaginal discharge, pelvic relaxation issues, and urinary complaints.  Musculoskeletal: Denied musculoskeletal symptoms, stiffness, swelling, muscle weakness and myalgia.  Dermatologic: Denied dermatology symptoms, rash and scar.  Neurologic: Denied neurology symptoms, dizziness, headache, neck pain and syncope.  Psychiatric: Denied psychiatric symptoms, anxiety and depression.  Endocrine: Denied endocrine symptoms including hot flashes and night sweats.   Meds:   Current Outpatient Medications on File Prior to Visit  Medication Sig Dispense Refill   docusate sodium (STOOL SOFTENER) 100 MG capsule Take 100 mg by mouth 2 (two) times daily.     furosemide (LASIX) 20 MG tablet Take 1 tablet (20 mg total) by mouth daily. For leg swelling 3 tablet 0   ibuprofen (ADVIL) 800 MG tablet Take 1 tablet (800 mg total) by mouth every 8 (eight) hours as needed. 60 tablet 1   Prenatal Vit-Fe Fumarate-FA (MULTIVITAMIN-PRENATAL) 27-0.8 MG TABS tablet Take 1 tablet by mouth daily at 12 noon.     No current facility-administered medications on file prior to visit.      Objective:     Vitals:   05/19/21 1047  BP: 134/90  Pulse: (!) 114   Filed Weights   05/19/21 1047  Weight: 185 lb 4.8 oz (84.1 kg)              Abdominal examination reveals normal postpartum abdomen.  Uterus PB +4  Assessment:    G2P1011 Patient Active Problem List   Diagnosis Date Noted   Post-dates pregnancy 05/10/2021   Pregnancy 05/02/2021   Group B streptococcal bacteriuria 11/19/2020   Acute pharyngitis 10/22/2020   Encounter for routine child health examination without  abnormal findings 10/22/2020   Awareness of heartbeats 10/22/2020   Epistaxis 10/22/2020   Fatigue 10/22/2020   Infection of the upper respiratory tract 10/22/2020   LBP (low back pain) 10/22/2020   Received influenza vaccination at hospital 10/22/2020   Irregular menses 01/10/2016   Episodic tension type headache 04/06/2013   Anxiety state, unspecified 01/25/2013   Depression 01/24/2013   Migraine without aura, without mention of intractable migraine without mention of status migrainosus 01/24/2013   Generalized abdominal pain    Vomiting    Nausea      1. Postpartum care and examination immediately after delivery   2. BP check     Blood pressure reasonable at current levels.  Continue medications.  Stretch marks and increased melanin during pregnancy discussed.  Plan:            1.  Continue blood pressure medication  2.  Follow-up as scheduled  3.  Reassured patient regarding shape and coloration of abdomen. Orders No orders of the defined types were placed in this encounter.   No orders of the defined types were placed in this encounter.     F/U  Return in about 1 week (around 05/26/2021).  Finis Bud, M.D. 05/19/2021 11:17 AM

## 2021-05-29 ENCOUNTER — Encounter: Payer: Self-pay | Admitting: Obstetrics and Gynecology

## 2021-05-29 ENCOUNTER — Telehealth (INDEPENDENT_AMBULATORY_CARE_PROVIDER_SITE_OTHER): Payer: BC Managed Care – PPO | Admitting: Obstetrics and Gynecology

## 2021-05-29 VITALS — Ht 63.0 in | Wt 177.0 lb

## 2021-05-29 DIAGNOSIS — Z8659 Personal history of other mental and behavioral disorders: Secondary | ICD-10-CM

## 2021-05-29 DIAGNOSIS — Z1332 Encounter for screening for maternal depression: Secondary | ICD-10-CM | POA: Diagnosis not present

## 2021-05-29 MED ORDER — BUSPIRONE HCL 5 MG PO TABS: 5.0000 mg | ORAL_TABLET | Freq: Three times a day (TID) | ORAL | 2 refills | Status: DC

## 2021-05-29 NOTE — Progress Notes (Signed)
Virtual Visit via Video Note  I connected with Kathy Pham on 05/29/21 at 11:30 AM EST  by a video enabled telemedicine application and verified that I am speaking with the correct person using two identifiers.  Location: Patient: Home Provider: Office   I discussed the limitations of evaluation and management by telemedicine and the availability of in person appointments. The patient expressed understanding and agreed to proceed.    History of Present Illness:   Kathy Pham is a 25 y.o. G31P1011 female who presents for a 2 week televisit for mood check. She has a previous history of anxiety. She is 2 weeks postpartum following a spontaneous vaginal delivery.   The delivery was at 41 gestational weeks.  Postpartum course has been well so far. Baby is feeding by breast (pumping). Notes that she thinks her baby may have a tongue tie due to difficulties with latching. Patient also has personal history of lip/tongue tie. Bleeding: is normal, light flow, no clots. Postpartum depression screening: negative. Anxiety screening is positive: 17.  EDPS score is 7.      The following portions of the patient's history were reviewed and updated as appropriate: allergies, current medications, past family history, past medical history, past social history, past surgical history, and problem list.   Observations/Objective:   Height 5\' 3"  (1.6 m), weight 177 lb (80.3 kg), currently breastfeeding. Gen App: NAD Psych: normal speech, affect. Good mood.     Edinburgh Postnatal Depression Scale Screening Tool 05/11/2021 05/11/2021  I have been able to laugh and see the funny side of things. 0 (No Data)  I have looked forward with enjoyment to things. 0 -  I have blamed myself unnecessarily when things went wrong. 1 -  I have been anxious or worried for no good reason. 1 -  I have felt scared or panicky for no good reason. 1 -  Things have been getting on top of me. 1 -  I have been so unhappy  that I have had difficulty sleeping. 1 -  I have felt sad or miserable. 1 -  I have been so unhappy that I have been crying. 1 -  The thought of harming myself has occurred to me. 0 -  Edinburgh Postnatal Depression Scale Total 7 -     GAD 7 : Generalized Anxiety Score 05/29/2021  Nervous, Anxious, on Edge 2  Control/stop worrying 3  Worry too much - different things 3  Trouble relaxing 2  Restless 2  Easily annoyed or irritable 3  Afraid - awful might happen 2  Total GAD 7 Score 17  Anxiety Difficulty Somewhat difficult      Assessment and Plan:   1. Encounter for screening for maternal depression - Screening negative today. Will rescreen at 6 week postpartum visit.    2. Postpartum state - Overall doing well. Continue routine postpartum home care.    3. Lactating mother - Doing well with pumping. Has issues with latching, concerned for tongue tie. Plans to have evaluated by Pediatrician next week.   4. History of Anxiety -  Patient with a history of anxiety prior to pregnancy, currently noting an exacerbation.  Discussion had with patient today regarding symptoms.    Patient notes that she has good support at home.  Discussed options for management, including counseling and/or medications, or both.  Patient desires both, will re-establish with her counselor and begin medication for now.  Will prescribe Buspar . To follow up in 3-4 weeks  with postpartum visit.     Follow Up Instructions:  Follow up in 4 weeks.    I discussed the assessment and treatment plan with the patient. The patient was provided an opportunity to ask questions and all were answered. The patient agreed with the plan and demonstrated an understanding of the instructions.   The patient was advised to call back or seek an in-person evaluation if the symptoms worsen or if the condition fails to improve as anticipated.   I provided 13 minutes of non-face-to-face time during this encounter.     Hildred Laser, MD [facility]

## 2021-05-31 ENCOUNTER — Encounter: Payer: Self-pay | Admitting: Obstetrics and Gynecology

## 2021-05-31 NOTE — Patient Instructions (Signed)

## 2021-06-20 ENCOUNTER — Other Ambulatory Visit: Payer: Self-pay | Admitting: Obstetrics and Gynecology

## 2021-06-26 ENCOUNTER — Ambulatory Visit (INDEPENDENT_AMBULATORY_CARE_PROVIDER_SITE_OTHER): Payer: Medicaid Other | Admitting: Obstetrics and Gynecology

## 2021-06-26 ENCOUNTER — Other Ambulatory Visit: Payer: Self-pay

## 2021-06-26 ENCOUNTER — Encounter: Payer: Self-pay | Admitting: Obstetrics and Gynecology

## 2021-06-26 DIAGNOSIS — Z8759 Personal history of other complications of pregnancy, childbirth and the puerperium: Secondary | ICD-10-CM

## 2021-06-26 DIAGNOSIS — O9081 Anemia of the puerperium: Secondary | ICD-10-CM

## 2021-06-26 DIAGNOSIS — O99345 Other mental disorders complicating the puerperium: Secondary | ICD-10-CM

## 2021-06-26 DIAGNOSIS — F418 Other specified anxiety disorders: Secondary | ICD-10-CM

## 2021-06-26 MED ORDER — BUSPIRONE HCL 10 MG PO TABS: 10.0000 mg | ORAL_TABLET | Freq: Three times a day (TID) | ORAL | 3 refills | Status: DC

## 2021-06-26 MED ORDER — SLYND 4 MG PO TABS: 1.0000 | ORAL_TABLET | Freq: Every day | ORAL | 3 refills | Status: AC

## 2021-06-26 NOTE — Progress Notes (Signed)
° °  OBSTETRICS POSTPARTUM CLINIC PROGRESS NOTE  Subjective:     Kathy Pham is a 26 y.o. G65P1011 female who presents for a postpartum visit. She is 6 weeks postpartum following a spontaneous vaginal delivery. I have fully reviewed the prenatal and intrapartum course. The delivery was at 40.5 gestational weeks, IOL for postdates pregnancy. Also with development of GHTN in labor.    Anesthesia: epidural. Postpartum course has been complicated by anxiety. Baby's course has been well. Baby is feeding by  breast (pumping) . Bleeding: patient is unsure if  cycles have resumed, reently had bleeding from 1/14-1/19, was light.  Bowel function is normal. Bladder function is normal. Patient is not sexually active. Contraception method desired is oral progesterone-only contraceptive. Postpartum depression screening: negative.  EDPS score is 6. Anxiety screening is positive. GAD-7 score is 15. Is taking Buspar, notes it helped initially, but no longer helpful as she has returned to work.    The following portions of the patient's history were reviewed and updated as appropriate: allergies, current medications, past family history, past medical history, past social history, past surgical history, and problem list.  Review of Systems Pertinent items noted in HPI and remainder of comprehensive ROS otherwise negative.   Objective:    BP 124/72    Pulse 76    Ht 5\' 3"  (1.6 m)    Wt 177 lb (80.3 kg)    SpO2 99%    BMI 31.35 kg/m   General:  alert and no distress   Breasts:  inspection negative, no nipple discharge or bleeding, no masses or nodularity palpable  Lungs: clear to auscultation bilaterally  Heart:  regular rate and rhythm, S1, S2 normal, no murmur, click, rub or gallop  Abdomen: soft, non-tender; bowel sounds normal; no masses,  no organomegaly.     Vulva:  normal  Vagina: normal vagina, no discharge, exudate, lesion, or erythema  Cervix:  no cervical motion tenderness and no lesions  Corpus:  normal size, contour, position, consistency, mobility, non-tender  Adnexa:  normal adnexa and no mass, fullness, tenderness  Rectal Exam: Not performed.         Labs:  Lab Results  Component Value Date   HGB 9.9 (L) 05/12/2021     Assessment:   1. Postpartum examination following vaginal delivery   2. History of gestational hypertension   3. Postpartum anxiety   4. Postpartum anemia      Plan:    1. Contraception: oral progesterone-only contraceptive.  Given sample of Slynd, and will send prescription to pharmacy. 2.  History of gestational hypertension diagnosed during labor.  Blood pressures within normal limits today currently on no medications.  No need for further interventions at this time. 3.  Postpartum anxiety not managed well with current dose of BuSpar.  I discussed options of increasing the current dosing, versus to a different medication.  Patient notes that she would prefer to increase the dose.  We will change from 5 mg to 10 mg 3 times daily. 4.  Postpartum anemia after delivery was mild.  Patient asymptomatic.  No need to reassess today. 5. Follow up in: 1 month for mood check (televisit), and in 6 months for annual exam.     14/11/2020, MD [facility]

## 2021-07-21 ENCOUNTER — Other Ambulatory Visit: Payer: Self-pay | Admitting: Obstetrics and Gynecology

## 2021-07-31 ENCOUNTER — Ambulatory Visit (INDEPENDENT_AMBULATORY_CARE_PROVIDER_SITE_OTHER): Payer: Medicaid Other | Admitting: Obstetrics and Gynecology

## 2021-07-31 ENCOUNTER — Other Ambulatory Visit: Payer: Self-pay

## 2021-07-31 ENCOUNTER — Encounter: Payer: Self-pay | Admitting: Obstetrics and Gynecology

## 2021-07-31 VITALS — Ht 63.0 in

## 2021-07-31 DIAGNOSIS — F418 Other specified anxiety disorders: Secondary | ICD-10-CM | POA: Diagnosis not present

## 2021-07-31 DIAGNOSIS — O99345 Other mental disorders complicating the puerperium: Secondary | ICD-10-CM | POA: Diagnosis not present

## 2021-07-31 NOTE — Progress Notes (Signed)
Virtual Visit via Telephone Note  I connected with Kathy Pham on 07/31/21 at  4:00 PM EST by telephone and verified that I am speaking with the correct person using two identifiers.  Location: Patient: Home Provider: Office   I discussed the limitations, risks, security and privacy concerns of performing an evaluation and management service by telephone and the availability of in person appointments. I also discussed with the patient that there may be a patient responsible charge related to this service. The patient expressed understanding and agreed to proceed.   History of Present Illness: Kathy Pham is a 26 y.o.G42P1011 female who presents for 1 month follow-up of postpartum anxiety.  Patient was initiated on Zoloft last visit.  She reports that her symptoms have greatly improved.  Is very excited about her results.  Patient also has questions regarding reducing her milk supply.  Notes that she produces so much that her infant sometimes chokes.  She is breast-feeding and pumping.  Has total production of approximately 80 mils per day.  Reports that she has been storing excess milk in the freezer and also donating to other moms via milk bank.   Observations/Objective: Height 5\' 3"  (1.6 m), currently breastfeeding.  Gen App: NAD Psych: normal affect, normal mood   GAD 7 : Generalized Anxiety Score 07/31/2021 06/26/2021 05/29/2021  Nervous, Anxious, on Edge 0 2 2  Control/stop worrying 0 2 3  Worry too much - different things 0 2 3  Trouble relaxing 0 3 2  Restless 0 1 2  Easily annoyed or irritable 1 2 3   Afraid - awful might happen 0 2 2  Total GAD 7 Score 1 14 17   Anxiety Difficulty - Somewhat difficult Somewhat difficult      Assessment and Plan: 1. Postpartum anxiety - Controlled with Zoloft.  Can continue medication at this time.  Patient advised to continue medication for at least 4 to 6 months.  If she desires to stop medication,  advised to wean, should notify  MD and weaning instructions will be given.  2. Lactating mother  -Discussed mechanical and medicinal methods to reduce milk supply.  Also discussed decreasing frequency of feeds as decreased demand will lead to decreased supply.    Follow Up Instructions:  Patient to follow-up in 3 to 4 months for annual exam.   I discussed the assessment and treatment plan with the patient. The patient was provided an opportunity to ask questions and all were answered. The patient agreed with the plan and demonstrated an understanding of the instructions.   The patient was advised to call back or seek an in-person evaluation if the symptoms worsen or if the condition fails to improve as anticipated.  I provided 11 minutes of non-face-to-face time during this encounter.   05/31/2021, MD [facility]

## 2021-12-25 ENCOUNTER — Encounter: Payer: Medicaid Other | Admitting: Obstetrics and Gynecology

## 2022-02-05 ENCOUNTER — Encounter: Payer: Medicaid Other | Admitting: Obstetrics and Gynecology

## 2022-09-23 ENCOUNTER — Encounter: Payer: Self-pay | Admitting: Surgery

## 2022-09-23 ENCOUNTER — Ambulatory Visit: Payer: Managed Care, Other (non HMO) | Admitting: Surgery

## 2022-09-23 ENCOUNTER — Ambulatory Visit: Payer: Self-pay | Admitting: Surgery

## 2022-09-23 VITALS — BP 136/82 | HR 95 | Temp 97.9°F | Ht 63.0 in | Wt 189.6 lb

## 2022-09-23 DIAGNOSIS — K429 Umbilical hernia without obstruction or gangrene: Secondary | ICD-10-CM | POA: Diagnosis not present

## 2022-09-23 NOTE — Patient Instructions (Addendum)
Our surgery scheduler Barbara will call you within 24-48 hours to get you scheduled. If you have not heard from her after 48 hours, please call our office. Have the blue sheet available when she calls to write down important information.   If you have any concerns or questions, please feel free to call our office.   Umbilical Hernia, Adult  A hernia is a bulge of tissue that pushes through an opening between muscles. An umbilical hernia happens in the abdomen, near the belly button (umbilicus). The hernia may contain tissues from the small intestine, large intestine, or fatty tissue covering the intestines. Umbilical hernias in adults tend to get worse over time, and they require surgical treatment. There are different types of umbilical hernias, including: Indirect hernia. This type is located just above or below the umbilicus. It is the most common type of umbilical hernia in adults. Direct hernia. This type forms through an opening formed by the umbilicus. Reducible hernia. This type of hernia comes and goes. It may be visible only when you strain, lift something heavy, or cough. This type of hernia can be pushed back into the abdomen (reduced). Incarcerated hernia. This type traps abdominal tissue inside the hernia. This type of hernia cannot be reduced. Strangulated hernia. This type of hernia cuts off blood flow to the tissues inside the hernia. The tissues can start to die if this happens. This type of hernia requires emergency treatment. What are the causes? An umbilical hernia happens when tissue inside the abdomen presses on a weak area of the abdominal muscles. What increases the risk? You may have a greater risk of this condition if you: Are obese. Have had several pregnancies. Have a buildup of fluid inside your abdomen. Have had surgery that weakens the abdominal muscles. What are the signs or symptoms? The main symptom of this condition is a painless bulge at or near the belly  button. A reducible hernia may be visible only when you strain, lift something heavy, or cough. Other symptoms may include: Dull pain. A feeling of pressure. Symptoms of a strangulated hernia may include: Pain that gets increasingly worse. Nausea and vomiting. Pain when pressing on the hernia. Skin over the hernia becoming red or purple. Constipation. Blood in the stool. How is this diagnosed? This condition may be diagnosed based on: A physical exam. You may be asked to cough or strain while standing. These actions increase the pressure inside your abdomen and can force the hernia through the opening in your muscles. Your health care provider may try to reduce the hernia by pressing on it. Your symptoms and medical history. How is this treated? Surgery is the only treatment for an umbilical hernia. Surgery for a strangulated hernia is done as soon as possible. If you have a small hernia that is not incarcerated, you may need to lose weight before having surgery. Follow these instructions at home: Lose weight, if told by your health care provider. Do not try to push the hernia back in. Watch your hernia for any changes in color or size. Tell your health care provider if any changes occur. You may need to avoid activities that increase pressure on your hernia. Do not lift anything that is heavier than 10 lb (4.5 kg), or the limit that you are told, until your health care provider says that it is safe. Take over-the-counter and prescription medicines only as told by your health care provider. Keep all follow-up visits. This is important. Contact a health care   provider if: Your hernia gets larger. Your hernia becomes painful. Get help right away if: You develop sudden, severe pain near the area of your hernia. You have pain as well as nausea or vomiting. You have pain and the skin over your hernia changes color. You develop a fever or chills. Summary A hernia is a bulge of tissue that  pushes through an opening between muscles. An umbilical hernia happens near the belly button. Surgery is the only treatment for an umbilical hernia. Do not try to push your hernia back in. Keep all follow-up visits. This is important. This information is not intended to replace advice given to you by your health care provider. Make sure you discuss any questions you have with your health care provider. Document Revised: 12/31/2019 Document Reviewed: 12/31/2019 Elsevier Patient Education  2023 [employer]  

## 2022-09-23 NOTE — Progress Notes (Signed)
Patient ID: Kathy Pham, female   DOB: December 30, 1995, 27 y.o.   MRN: [id number]  Chief Complaint: Umbilical hernia  History of Present Illness Kathy Pham is a 27 y.o. female with a new periumbilical bulge, tender to touch over the last week.  Apparently this follows some significant episodes of violent coughing following a URI.  Has been a little more tender and a little larger on a daily basis.  She denies fevers, chills, nausea or vomiting.  No prior abdominal surgeries.  Past Medical History Past Medical History:  Diagnosis Date   Abdominal pain, recurrent    Dysmenorrhea    Headache(784.0)    Menorrhagia    Nausea    Vomiting       Past Surgical History:  Procedure Laterality Date   ESOPHAGOGASTRODUODENOSCOPY  08/20/2011   Procedure: ESOPHAGOGASTRODUODENOSCOPY (EGD);  Surgeon: Jon Gills, MD;  Location: [facility] OR;  Service: Gastroenterology;  Laterality: N/A;    No Known Allergies  Current Outpatient Medications  Medication Sig Dispense Refill   busPIRone (BUSPAR) 10 MG tablet TAKE 1 TABLET BY MOUTH THREE TIMES A DAY 270 tablet 2   Drospirenone (SLYND) 4 MG TABS Take 1 tablet by mouth daily. 84 tablet 3   ibuprofen (ADVIL) 800 MG tablet Take 1 tablet (800 mg total) by mouth every 8 (eight) hours as needed. 60 tablet 1   Prenatal Vit-Fe Fumarate-FA (MULTIVITAMIN-PRENATAL) 27-0.8 MG TABS tablet Take 1 tablet by mouth daily at 12 noon.     No current facility-administered medications for this visit.    Family History Family History  Problem Relation Age of Onset   GER disease Father    Diabetes Mother    Diabetes Maternal Grandfather    Diabetes Paternal Grandmother    Cervical cancer Paternal Grandmother    Breast cancer Neg Hx    Ovarian cancer Neg Hx    Colon cancer Neg Hx       Social History Social History   Tobacco Use   Smoking status: Former    Packs/day: .25    Types: Cigarettes    Quit date: 04/09/2019    Years since quitting: 3.4   Smokeless  tobacco: Never  Vaping Use   Vaping Use: Never used  Substance Use Topics   Alcohol use: Not Currently   Drug use: Never        Review of Systems  Constitutional: Negative.   HENT: Negative.    Eyes: Negative.   Respiratory: Negative.    Cardiovascular: Negative.   Gastrointestinal:  Positive for abdominal pain.  Genitourinary: Negative.   Skin: Negative.   Neurological: Negative.   Psychiatric/Behavioral: Negative.       Physical Exam Blood pressure 136/82, pulse 95, temperature 97.9 F (36.6 C), temperature source Oral, height  (1.6 m), weight 189 lb 9.6 oz (86 kg), SpO2 98 %, currently breastfeeding. Last Weight  Most recent update: 09/23/2022 10:58 AM    Weight  86 kg (189 lb 9.6 oz)             CONSTITUTIONAL: Well developed, and nourished, appropriately responsive and aware without distress.   EYES: Sclera non-icteric.   EARS, NOSE, MOUTH AND THROAT:  The oropharynx is clear. Oral mucosa is pink and moist.    Hearing is intact to voice.  NECK: Trachea is midline, and there is no jugular venous distension.  LYMPH NODES:  Lymph nodes in the neck are not appreciated. RESPIRATORY:  Lungs are clear, and breath sounds are equal  bilaterally.  Normal respiratory effort without pathologic use of accessory muscles. CARDIOVASCULAR: Heart is regular in rate and rhythm.   Well perfused.  GI: The abdomen is notable for a small periumbilical lump that is tender to palpation, consistent with a small fascial defect.  No evidence of diastases recti.  Otherwise soft, nontender, and nondistended. There were no other palpable masses.  I did not appreciate hepatosplenomegaly.  MUSCULOSKELETAL:  Symmetrical muscle tone appreciated in all four extremities.    SKIN: Skin turgor is normal. No pathologic skin lesions appreciated.  NEUROLOGIC:  Motor and sensation appear grossly normal.  Cranial nerves are grossly without defect. PSYCH:  Alert and oriented to person, place and time.  Affect is appropriate for situation.  Data Reviewed I have personally reviewed what is currently available of the patient's imaging, recent labs and medical records.   Labs:     Latest Ref Rng & Units 05/12/2021    2:29 AM 05/10/2021    5:53 AM 04/28/2021    5:23 PM  CBC  WBC 4.0 - 10.5 K/uL 8.7  7.6  8.9   Hemoglobin 12.0 - 15.0 g/dL 9.9  16.1  09.6   Hematocrit 36.0 - 46.0 % 29.9  35.6  35.9   Platelets 150 - 400 K/uL 138  155  176       Latest Ref Rng & Units 04/28/2021    5:23 PM 04/15/2021    5:06 PM 05/08/2015   11:15 PM  CMP  Glucose 70 - 99 mg/dL 91   91   BUN 6 - 20 mg/dL 13   20   Creatinine 0.45 - 1.00 mg/dL 4.09   8.11   Sodium 914 - 145 mmol/L 135   137   Potassium 3.5 - 5.1 mmol/L 4.0   3.5   Chloride 98 - 111 mmol/L 105   103   CO2 22 - 32 mmol/L 23   24   Calcium 8.9 - 10.3 mg/dL 8.7   9.6   Total Protein 6.5 - 8.1 g/dL 6.8  6.6  8.6   Total Bilirubin 0.3 - 1.2 mg/dL 0.4  0.5  0.6   Alkaline Phos 38 - 126 U/L 100  92  89   AST 15 - 41 U/L ALT 0 - 44 U/L Imaging: Radiological images reviewed:   Within last 24 hrs: No results found.  Assessment    Small umbilical hernia defect. Patient Active Problem List   Diagnosis Date Noted   Post-dates pregnancy 05/10/2021   Pregnancy 05/02/2021   Group B streptococcal bacteriuria 11/19/2020   Acute pharyngitis 10/22/2020   Encounter for routine child health examination without abnormal findings 10/22/2020   Awareness of heartbeats 10/22/2020   Epistaxis 10/22/2020   Fatigue 10/22/2020   Infection of the upper respiratory tract 10/22/2020   LBP (low back pain) 10/22/2020   Received influenza vaccination at hospital 10/22/2020   Irregular menses 01/10/2016   Episodic tension type headache 04/06/2013   Anxiety state, unspecified 01/25/2013   Depression 01/24/2013   Migraine without aura, without mention of intractable migraine without mention of status migrainosus 01/24/2013    Generalized abdominal pain    Vomiting    Nausea     Plan    Open repair of less than 3 cm anterior abdominal wall fascial defect, initial.  I discussed possibility of incarceration, strangulation, enlargement in size over time, and the need  for emergency surgery in the face of these.  Also reviewed the techniques of reduction should incarceration occur, and when unsuccessful to present to the ED.  Also discussed that surgery risks include recurrence which can be up to 30% in the case of complex hernias, use of prosthetic materials (mesh) and the increased risk of infection and the possible need for re-operation and removal of mesh, possibility of post-op SBO or ileus, and the risks of general anesthetic including heart attack, stroke, sudden death or some reaction to anesthetic medications. The patient, and those present, appear to understand the risks, any and all questions were answered to the patient's satisfaction.  No guarantees were ever expressed or implied.   Face-to-face time spent with the patient and accompanying care providers(if present) was 30 minutes, with more than 50% of the time spent counseling, educating, and coordinating care of the patient.    These notes generated with voice recognition software. I apologize for typographical errors.  Campbell Lerner M.D., FACS 09/23/2022, 1:26 PM

## 2022-09-23 NOTE — H&P (View-Only) (Signed)
Patient ID: Kathy Pham, female   DOB: December 30, 1995, 27 y.o.   MRN: [id number]  Chief Complaint: Umbilical hernia  History of Present Illness Kathy Pham is a 27 y.o. female with a new periumbilical bulge, tender to touch over the last week.  Apparently this follows some significant episodes of violent coughing following a URI.  Has been a little more tender and a little larger on a daily basis.  She denies fevers, chills, nausea or vomiting.  No prior abdominal surgeries.  Past Medical History Past Medical History:  Diagnosis Date   Abdominal pain, recurrent    Dysmenorrhea    Headache(784.0)    Menorrhagia    Nausea    Vomiting       Past Surgical History:  Procedure Laterality Date   ESOPHAGOGASTRODUODENOSCOPY  08/20/2011   Procedure: ESOPHAGOGASTRODUODENOSCOPY (EGD);  Surgeon: Jon Gills, MD;  Location: [facility] OR;  Service: Gastroenterology;  Laterality: N/A;    No Known Allergies  Current Outpatient Medications  Medication Sig Dispense Refill   busPIRone (BUSPAR) 10 MG tablet TAKE 1 TABLET BY MOUTH THREE TIMES A DAY 270 tablet 2   Drospirenone (SLYND) 4 MG TABS Take 1 tablet by mouth daily. 84 tablet 3   ibuprofen (ADVIL) 800 MG tablet Take 1 tablet (800 mg total) by mouth every 8 (eight) hours as needed. 60 tablet 1   Prenatal Vit-Fe Fumarate-FA (MULTIVITAMIN-PRENATAL) 27-0.8 MG TABS tablet Take 1 tablet by mouth daily at 12 noon.     No current facility-administered medications for this visit.    Family History Family History  Problem Relation Age of Onset   GER disease Father    Diabetes Mother    Diabetes Maternal Grandfather    Diabetes Paternal Grandmother    Cervical cancer Paternal Grandmother    Breast cancer Neg Hx    Ovarian cancer Neg Hx    Colon cancer Neg Hx       Social History Social History   Tobacco Use   Smoking status: Former    Packs/day: .25    Types: Cigarettes    Quit date: 04/09/2019    Years since quitting: 3.4   Smokeless  tobacco: Never  Vaping Use   Vaping Use: Never used  Substance Use Topics   Alcohol use: Not Currently   Drug use: Never        Review of Systems  Constitutional: Negative.   HENT: Negative.    Eyes: Negative.   Respiratory: Negative.    Cardiovascular: Negative.   Gastrointestinal:  Positive for abdominal pain.  Genitourinary: Negative.   Skin: Negative.   Neurological: Negative.   Psychiatric/Behavioral: Negative.       Physical Exam Blood pressure 136/82, pulse 95, temperature 97.9 F (36.6 C), temperature source Oral, height  (1.6 m), weight 189 lb 9.6 oz (86 kg), SpO2 98 %, currently breastfeeding. Last Weight  Most recent update: 09/23/2022 10:58 AM    Weight  86 kg (189 lb 9.6 oz)             CONSTITUTIONAL: Well developed, and nourished, appropriately responsive and aware without distress.   EYES: Sclera non-icteric.   EARS, NOSE, MOUTH AND THROAT:  The oropharynx is clear. Oral mucosa is pink and moist.    Hearing is intact to voice.  NECK: Trachea is midline, and there is no jugular venous distension.  LYMPH NODES:  Lymph nodes in the neck are not appreciated. RESPIRATORY:  Lungs are clear, and breath sounds are equal  bilaterally.  Normal respiratory effort without pathologic use of accessory muscles. CARDIOVASCULAR: Heart is regular in rate and rhythm.   Well perfused.  GI: The abdomen is notable for a small periumbilical lump that is tender to palpation, consistent with a small fascial defect.  No evidence of diastases recti.  Otherwise soft, nontender, and nondistended. There were no other palpable masses.  I did not appreciate hepatosplenomegaly.  MUSCULOSKELETAL:  Symmetrical muscle tone appreciated in all four extremities.    SKIN: Skin turgor is normal. No pathologic skin lesions appreciated.  NEUROLOGIC:  Motor and sensation appear grossly normal.  Cranial nerves are grossly without defect. PSYCH:  Alert and oriented to person, place and time.  Affect is appropriate for situation.  Data Reviewed I have personally reviewed what is currently available of the patient's imaging, recent labs and medical records.   Labs:     Latest Ref Rng & Units 05/12/2021    2:29 AM 05/10/2021    5:53 AM 04/28/2021    5:23 PM  CBC  WBC 4.0 - 10.5 K/uL 8.7  7.6  8.9   Hemoglobin 12.0 - 15.0 g/dL 9.9  16.1  09.6   Hematocrit 36.0 - 46.0 % 29.9  35.6  35.9   Platelets 150 - 400 K/uL 138  155  176       Latest Ref Rng & Units 04/28/2021    5:23 PM 04/15/2021    5:06 PM 05/08/2015   11:15 PM  CMP  Glucose 70 - 99 mg/dL 91   91   BUN 6 - 20 mg/dL 13   20   Creatinine 0.45 - 1.00 mg/dL 4.09   8.11   Sodium 914 - 145 mmol/L 135   137   Potassium 3.5 - 5.1 mmol/L 4.0   3.5   Chloride 98 - 111 mmol/L 105   103   CO2 22 - 32 mmol/L 23   24   Calcium 8.9 - 10.3 mg/dL 8.7   9.6   Total Protein 6.5 - 8.1 g/dL 6.8  6.6  8.6   Total Bilirubin 0.3 - 1.2 mg/dL 0.4  0.5  0.6   Alkaline Phos 38 - 126 U/L 100  92  89   AST 15 - 41 U/L ALT 0 - 44 U/L Imaging: Radiological images reviewed:   Within last 24 hrs: No results found.  Assessment    Small umbilical hernia defect. Patient Active Problem List   Diagnosis Date Noted   Post-dates pregnancy 05/10/2021   Pregnancy 05/02/2021   Group B streptococcal bacteriuria 11/19/2020   Acute pharyngitis 10/22/2020   Encounter for routine child health examination without abnormal findings 10/22/2020   Awareness of heartbeats 10/22/2020   Epistaxis 10/22/2020   Fatigue 10/22/2020   Infection of the upper respiratory tract 10/22/2020   LBP (low back pain) 10/22/2020   Received influenza vaccination at hospital 10/22/2020   Irregular menses 01/10/2016   Episodic tension type headache 04/06/2013   Anxiety state, unspecified 01/25/2013   Depression 01/24/2013   Migraine without aura, without mention of intractable migraine without mention of status migrainosus 01/24/2013    Generalized abdominal pain    Vomiting    Nausea     Plan    Open repair of less than 3 cm anterior abdominal wall fascial defect, initial.  I discussed possibility of incarceration, strangulation, enlargement in size over time, and the need  for emergency surgery in the face of these.  Also reviewed the techniques of reduction should incarceration occur, and when unsuccessful to present to the ED.  Also discussed that surgery risks include recurrence which can be up to 30% in the case of complex hernias, use of prosthetic materials (mesh) and the increased risk of infection and the possible need for re-operation and removal of mesh, possibility of post-op SBO or ileus, and the risks of general anesthetic including heart attack, stroke, sudden death or some reaction to anesthetic medications. The patient, and those present, appear to understand the risks, any and all questions were answered to the patient's satisfaction.  No guarantees were ever expressed or implied.   Face-to-face time spent with the patient and accompanying care providers(if present) was 30 minutes, with more than 50% of the time spent counseling, educating, and coordinating care of the patient.    These notes generated with voice recognition software. I apologize for typographical errors.  Campbell Lerner M.D., FACS 09/23/2022, 1:26 PM

## 2022-09-24 ENCOUNTER — Telehealth: Payer: Self-pay | Admitting: Surgery

## 2022-09-24 NOTE — Telephone Encounter (Signed)
Patient has been advised of Pre-Admission date/time, and Surgery date at [facility].  Surgery Date: 09/29/22 Preadmission Testing Date: 09/27/22 (phone 1p-4p)  Patient has been made aware to call [phone], between 1-3:00pm the day before surgery, to find out what time to arrive for surgery.

## 2022-09-27 ENCOUNTER — Encounter
Admission: RE | Admit: 2022-09-27 | Discharge: 2022-09-27 | Disposition: A | Discharge status: Home / Self Care | Payer: Managed Care, Other (non HMO) | Source: Ambulatory Visit | Attending: Surgery | Admitting: Surgery

## 2022-09-27 DIAGNOSIS — Z01818 Encounter for other preprocedural examination: Secondary | ICD-10-CM

## 2022-09-27 HISTORY — DX: Anxiety disorder, unspecified: F41.9

## 2022-09-27 HISTORY — DX: Personal history of Methicillin resistant Staphylococcus aureus infection: Z86.14

## 2022-09-27 NOTE — Patient Instructions (Signed)
Your procedure is scheduled on:09-29-22 Wednesday Report to the Registration Desk on the 1st floor of the Medical [facility].Then proceed to the 2nd floor Surgery Desk To find out your arrival time, please call [phone] between 1PM - 3PM on:09-28-22 Tuesday If your arrival time is 6:00 am, do not arrive before that time as the [facility] entrance doors do not open until 6:00 am.  REMEMBER: Instructions that are not followed completely may result in serious medical risk, up to and including death; or upon the discretion of your surgeon and anesthesiologist your surgery may need to be rescheduled.  Do not eat food OR drink any liquids after midnight the night before surgery.  No gum chewing or hard candies.  One week prior to surgery: Stop Anti-inflammatories (NSAIDS) such as Advil, Aleve, Ibuprofen, Motrin, Naproxen, Naprosyn and Aspirin based products such as Excedrin, Goody's Powder, BC Powder.You may however, take Tylenol if needed for pain up until the day of surgery. Stop ANY OVER THE COUNTER supplements/vitamins NOW (09-27-22) until after surgery.  TAKE ONLY THESE MEDICATIONS THE MORNING OF SURGERY WITH A SIP OF WATER: -Drospirenone (SLYND)  -You may take your busPIRone (BUSPAR) if needed for anxiety  No Alcohol for 24 hours before or after surgery.  No Smoking including e-cigarettes for 24 hours before surgery.  No chewable tobacco products for at least 6 hours before surgery.  No nicotine patches on the day of surgery.  Do not use any "recreational" drugs for at least a week (preferably 2 weeks) before your surgery.  Please be advised that the combination of cocaine and anesthesia may have negative outcomes, up to and including death. If you test positive for cocaine, your surgery will be cancelled.  On the morning of surgery brush your teeth with toothpaste and water, you may rinse your mouth with mouthwash if you wish. Do not swallow any toothpaste or mouthwash.  Use CHG Soap  as directed on instruction sheet.  Do not wear jewelry, make-up, hairpins, clips or nail polish.  Do not wear lotions, powders, or perfumes.   Do not shave body hair from the neck down 48 hours before surgery.  Contact lenses, hearing aids and dentures may not be worn into surgery.  Do not bring valuables to the hospital. [facility] is not responsible for any missing/lost belongings or valuables.  Notify your doctor if there is any change in your medical condition (cold, fever, infection).  Wear comfortable clothing (specific to your surgery type) to the hospital.  After surgery, you can help prevent lung complications by doing breathing exercises.  Take deep breaths and cough every 1-2 hours. Your doctor may order a device called an Incentive Spirometer to help you take deep breaths. When coughing or sneezing, hold a pillow firmly against your incision with both hands. This is called "splinting." Doing this helps protect your incision. It also decreases belly discomfort.  If you are being admitted to the hospital overnight, leave your suitcase in the car. After surgery it may be brought to your room.  In case of increased patient census, it may be necessary for you, the patient, to continue your postoperative care in the Same Day Surgery department.  If you are being discharged the day of surgery, you will not be allowed to drive home. You will need a responsible individual to drive you home and stay with you for 24 hours after surgery.   If you are taking public transportation, you will need to have a responsible individual with  you.  Please call the Pre-admissions Testing Dept. at [phone] if you have any questions about these instructions.  Surgery Visitation Policy:  Patients having surgery or a procedure may have two visitors.  Children under the age of 15 must have an adult with them who is not the patient.     Preparing for Surgery with CHLORHEXIDINE GLUCONATE  (CHG) Soap  Chlorhexidine Gluconate (CHG) Soap  o An antiseptic cleaner that kills germs and bonds with the skin to continue killing germs even after washing  o Used for showering the night before surgery and morning of surgery  Before surgery, you can play an important role by reducing the number of germs on your skin.  CHG (Chlorhexidine gluconate) soap is an antiseptic cleanser which kills germs and bonds with the skin to continue killing germs even after washing.  Please do not use if you have an allergy to CHG or antibacterial soaps. If your skin becomes reddened/irritated stop using the CHG.  1. Shower the NIGHT BEFORE SURGERY and the MORNING OF SURGERY with CHG soap.  2. If you choose to wash your hair, wash your hair first as usual with your normal shampoo.  3. After shampooing, rinse your hair and body thoroughly to remove the shampoo.  4. Use CHG as you would any other liquid soap. You can apply CHG directly to the skin and wash gently with a scrungie or a clean washcloth.  5. Apply the CHG soap to your body only from the neck down. Do not use on open wounds or open sores. Avoid contact with your eyes, ears, mouth, and genitals (private parts). Wash face and genitals (private parts) with your normal soap.  6. Wash thoroughly, paying special attention to the area where your surgery will be performed.  7. Thoroughly rinse your body with warm water.  8. Do not shower/wash with your normal soap after using and rinsing off the CHG soap.  9. Pat yourself dry with a clean towel.  10. Wear clean pajamas to bed the night before surgery.  12. Place clean sheets on your bed the night of your first shower and do not sleep with pets.  13. Shower again with the CHG soap on the day of surgery prior to arriving at the hospital.  14. Do not apply any deodorants/lotions/powders.  15. Please wear clean clothes to the hospital.

## 2022-09-28 MED ORDER — ACETAMINOPHEN 500 MG PO TABS
1000.0000 mg | ORAL_TABLET | ORAL | Status: AC
  Administered 2022-09-29: 1000 mg via ORAL

## 2022-09-28 MED ORDER — CHLORHEXIDINE GLUCONATE 0.12 % MT SOLN
15.0000 mL | Freq: Once | OROMUCOSAL | Status: AC
  Administered 2022-09-29: 15 mL via OROMUCOSAL

## 2022-09-28 MED ORDER — GABAPENTIN 300 MG PO CAPS
300.0000 mg | ORAL_CAPSULE | ORAL | Status: AC
  Administered 2022-09-29: 300 mg via ORAL

## 2022-09-28 MED ORDER — FAMOTIDINE 20 MG PO TABS
20.0000 mg | ORAL_TABLET | Freq: Once | ORAL | Status: AC
  Administered 2022-09-29: 20 mg via ORAL

## 2022-09-28 MED ORDER — LACTATED RINGERS IV SOLN: INTRAVENOUS | Status: DC

## 2022-09-28 MED ORDER — ORAL CARE MOUTH RINSE: 15.0000 mL | Freq: Once | OROMUCOSAL | Status: AC

## 2022-09-28 MED ORDER — CHLORHEXIDINE GLUCONATE CLOTH 2 % EX PADS: 6.0000 | MEDICATED_PAD | Freq: Once | CUTANEOUS | Status: DC

## 2022-09-28 MED ORDER — BUPIVACAINE LIPOSOME 1.3 % IJ SUSP: 20.0000 mL | Freq: Once | INTRAMUSCULAR | Status: DC

## 2022-09-28 MED ORDER — CHLORHEXIDINE GLUCONATE CLOTH 2 % EX PADS
6.0000 | MEDICATED_PAD | Freq: Once | CUTANEOUS | Status: AC
  Administered 2022-09-29: 6 via TOPICAL

## 2022-09-28 MED ORDER — CEFAZOLIN SODIUM-DEXTROSE 2-4 GM/100ML-% IV SOLN
2.0000 g | INTRAVENOUS | Status: AC
  Administered 2022-09-29: 2 g via INTRAVENOUS

## 2022-09-28 MED ORDER — CELECOXIB 200 MG PO CAPS
200.0000 mg | ORAL_CAPSULE | ORAL | Status: AC
  Administered 2022-09-29: 200 mg via ORAL

## 2022-09-29 ENCOUNTER — Other Ambulatory Visit: Payer: Self-pay

## 2022-09-29 ENCOUNTER — Encounter
Admission: RE | Disposition: A | Discharge status: Home / Self Care | Payer: Self-pay | Source: Ambulatory Visit | Attending: Surgery

## 2022-09-29 ENCOUNTER — Ambulatory Visit
Admission: RE | Admit: 2022-09-29 | Discharge: 2022-09-29 | Disposition: A | Discharge status: Home / Self Care | Payer: Managed Care, Other (non HMO) | Source: Ambulatory Visit | Attending: Surgery | Admitting: Surgery

## 2022-09-29 ENCOUNTER — Encounter: Payer: Self-pay | Admitting: Surgery

## 2022-09-29 ENCOUNTER — Other Ambulatory Visit: Payer: Self-pay | Admitting: Surgery

## 2022-09-29 ENCOUNTER — Ambulatory Visit: Payer: Managed Care, Other (non HMO) | Admitting: Urgent Care

## 2022-09-29 ENCOUNTER — Ambulatory Visit: Payer: Managed Care, Other (non HMO) | Admitting: Certified Registered"

## 2022-09-29 DIAGNOSIS — Z87891 Personal history of nicotine dependence: Secondary | ICD-10-CM | POA: Diagnosis not present

## 2022-09-29 DIAGNOSIS — K429 Umbilical hernia without obstruction or gangrene: Secondary | ICD-10-CM

## 2022-09-29 DIAGNOSIS — Z8614 Personal history of Methicillin resistant Staphylococcus aureus infection: Secondary | ICD-10-CM | POA: Diagnosis not present

## 2022-09-29 DIAGNOSIS — Z01818 Encounter for other preprocedural examination: Secondary | ICD-10-CM

## 2022-09-29 HISTORY — PX: UMBILICAL HERNIA REPAIR: SHX196

## 2022-09-29 LAB — POCT URINE PREGNANCY: Preg Test, Ur: NEGATIVE

## 2022-09-29 SURGERY — REPAIR, HERNIA, UMBILICAL, ADULT
Anesthesia: General

## 2022-09-29 MED ORDER — BUPIVACAINE-EPINEPHRINE 0.25% -1:200000 IJ SOLN
INTRAMUSCULAR | Status: DC | PRN
  Administered 2022-09-29: 7 mL

## 2022-09-29 MED ORDER — EPINEPHRINE PF 1 MG/ML IJ SOLN
INTRAMUSCULAR | Status: AC
  Filled 2022-09-29: qty 1

## 2022-09-29 MED ORDER — ONDANSETRON HCL 4 MG/2ML IJ SOLN
INTRAMUSCULAR | Status: DC | PRN
  Administered 2022-09-29: 4 mg via INTRAVENOUS

## 2022-09-29 MED ORDER — PROMETHAZINE HCL 25 MG/ML IJ SOLN
6.2500 mg | INTRAMUSCULAR | Status: DC | PRN
  Administered 2022-09-29: 6.25 mg via INTRAVENOUS

## 2022-09-29 MED ORDER — PROPOFOL 10 MG/ML IV BOLUS
INTRAVENOUS | Status: AC
  Filled 2022-09-29: qty 20

## 2022-09-29 MED ORDER — ONDANSETRON HCL 4 MG/2ML IJ SOLN
INTRAMUSCULAR | Status: AC
  Filled 2022-09-29: qty 2

## 2022-09-29 MED ORDER — MIDAZOLAM HCL 2 MG/2ML IJ SOLN
INTRAMUSCULAR | Status: AC
  Filled 2022-09-29: qty 2

## 2022-09-29 MED ORDER — PROMETHAZINE HCL 25 MG/ML IJ SOLN
INTRAMUSCULAR | Status: AC
  Filled 2022-09-29: qty 1

## 2022-09-29 MED ORDER — ACETAMINOPHEN 500 MG PO TABS
ORAL_TABLET | ORAL | Status: AC
  Filled 2022-09-29: qty 2

## 2022-09-29 MED ORDER — PROPOFOL 10 MG/ML IV BOLUS
INTRAVENOUS | Status: DC | PRN
  Administered 2022-09-29: 200 mg via INTRAVENOUS

## 2022-09-29 MED ORDER — BUPIVACAINE HCL (PF) 0.25 % IJ SOLN
INTRAMUSCULAR | Status: AC
  Filled 2022-09-29: qty 30

## 2022-09-29 MED ORDER — GABAPENTIN 300 MG PO CAPS
ORAL_CAPSULE | ORAL | Status: AC
  Filled 2022-09-29: qty 1

## 2022-09-29 MED ORDER — HYDROCODONE-ACETAMINOPHEN 5-325 MG PO TABS: 1.0000 | ORAL_TABLET | Freq: Four times a day (QID) | ORAL | 0 refills | Status: DC | PRN

## 2022-09-29 MED ORDER — FENTANYL CITRATE (PF) 100 MCG/2ML IJ SOLN
INTRAMUSCULAR | Status: DC | PRN
  Administered 2022-09-29: 50 ug via INTRAVENOUS

## 2022-09-29 MED ORDER — CELECOXIB 200 MG PO CAPS
ORAL_CAPSULE | ORAL | Status: AC
  Filled 2022-09-29: qty 1

## 2022-09-29 MED ORDER — ROCURONIUM BROMIDE 100 MG/10ML IV SOLN
INTRAVENOUS | Status: DC | PRN
  Administered 2022-09-29: 50 mg via INTRAVENOUS

## 2022-09-29 MED ORDER — LIDOCAINE HCL (PF) 2 % IJ SOLN
INTRAMUSCULAR | Status: AC
  Filled 2022-09-29: qty 5

## 2022-09-29 MED ORDER — FAMOTIDINE 20 MG PO TABS
ORAL_TABLET | ORAL | Status: AC
  Filled 2022-09-29: qty 1

## 2022-09-29 MED ORDER — LIDOCAINE HCL (CARDIAC) PF 100 MG/5ML IV SOSY
PREFILLED_SYRINGE | INTRAVENOUS | Status: DC | PRN
  Administered 2022-09-29: 100 mg via INTRAVENOUS

## 2022-09-29 MED ORDER — FENTANYL CITRATE (PF) 100 MCG/2ML IJ SOLN
INTRAMUSCULAR | Status: AC
  Filled 2022-09-29: qty 2

## 2022-09-29 MED ORDER — CHLORHEXIDINE GLUCONATE 0.12 % MT SOLN
OROMUCOSAL | Status: AC
  Filled 2022-09-29: qty 15

## 2022-09-29 MED ORDER — DEXAMETHASONE SODIUM PHOSPHATE 10 MG/ML IJ SOLN
INTRAMUSCULAR | Status: DC | PRN
  Administered 2022-09-29: 10 mg via INTRAVENOUS

## 2022-09-29 MED ORDER — SUGAMMADEX SODIUM 200 MG/2ML IV SOLN
INTRAVENOUS | Status: DC | PRN
  Administered 2022-09-29: 200 mg via INTRAVENOUS

## 2022-09-29 MED ORDER — 0.9 % SODIUM CHLORIDE (POUR BTL) OPTIME
TOPICAL | Status: DC | PRN
  Administered 2022-09-29: 50 mL

## 2022-09-29 MED ORDER — FENTANYL CITRATE (PF) 100 MCG/2ML IJ SOLN
25.0000 ug | INTRAMUSCULAR | Status: DC | PRN
  Administered 2022-09-29 (×3): 25 ug via INTRAVENOUS

## 2022-09-29 MED ORDER — DEXAMETHASONE SODIUM PHOSPHATE 10 MG/ML IJ SOLN
INTRAMUSCULAR | Status: AC
  Filled 2022-09-29: qty 1

## 2022-09-29 MED ORDER — CEFAZOLIN SODIUM-DEXTROSE 2-4 GM/100ML-% IV SOLN
INTRAVENOUS | Status: AC
  Filled 2022-09-29: qty 100

## 2022-09-29 MED ORDER — HYDROCODONE-ACETAMINOPHEN 5-325 MG PO TABS
1.0000 | ORAL_TABLET | Freq: Four times a day (QID) | ORAL | 0 refills | Status: DC | PRN
Start: 2022-09-29 — End: 2022-09-29

## 2022-09-29 MED ORDER — ROCURONIUM BROMIDE 10 MG/ML (PF) SYRINGE
PREFILLED_SYRINGE | INTRAVENOUS | Status: AC
  Filled 2022-09-29: qty 10

## 2022-09-29 SURGICAL SUPPLY — 36 items
ADH SKN CLS APL DERMABOND .7 (GAUZE/BANDAGES/DRESSINGS) ×1
APL PRP STRL LF DISP 70% ISPRP (MISCELLANEOUS) ×1
BLADE CLIPPER SURG (BLADE) IMPLANT
BLADE SURG 15 STRL LF DISP TIS (BLADE) ×1 IMPLANT
BLADE SURG 15 STRL SS (BLADE) ×1
CHLORAPREP W/TINT 26 (MISCELLANEOUS) ×1 IMPLANT
DERMABOND ADVANCED .7 DNX12 (GAUZE/BANDAGES/DRESSINGS) ×1 IMPLANT
DRAPE LAPAROTOMY 77X122 PED (DRAPES) ×1 IMPLANT
ELECT CAUTERY BLADE 6.4 (BLADE) ×1 IMPLANT
ELECT REM PT RETURN 9FT ADLT (ELECTROSURGICAL) ×1
ELECTRODE REM PT RTRN 9FT ADLT (ELECTROSURGICAL) ×1 IMPLANT
GAUZE 4X4 16PLY ~~LOC~~+RFID DBL (SPONGE) ×1 IMPLANT
GLOVE BIO SURGEON STRL SZ 6.5 (GLOVE) IMPLANT
GLOVE BIO SURGEON STRL SZ7 (GLOVE) IMPLANT
GLOVE BIOGEL PI IND STRL 7.0 (GLOVE) IMPLANT
GLOVE ORTHO TXT STRL SZ7.5 (GLOVE) ×1 IMPLANT
GOWN STRL REUS W/ TWL LRG LVL3 (GOWN DISPOSABLE) ×1 IMPLANT
GOWN STRL REUS W/ TWL XL LVL3 (GOWN DISPOSABLE) ×1 IMPLANT
GOWN STRL REUS W/TWL LRG LVL3 (GOWN DISPOSABLE) ×1
GOWN STRL REUS W/TWL XL LVL3 (GOWN DISPOSABLE) ×2
KIT TURNOVER KIT A (KITS) ×1 IMPLANT
MANIFOLD NEPTUNE II (INSTRUMENTS) ×1 IMPLANT
NDL HYPO 22X1.5 SAFETY MO (MISCELLANEOUS) ×1 IMPLANT
NEEDLE HYPO 22X1.5 SAFETY MO (MISCELLANEOUS) ×1 IMPLANT
NS IRRIG 500ML POUR BTL (IV SOLUTION) ×1 IMPLANT
PACK BASIN MINOR ARMC (MISCELLANEOUS) ×1 IMPLANT
SPIKE FLUID TRANSFER (MISCELLANEOUS) ×1 IMPLANT
SUT ETHIBOND 0 MO6 C/R (SUTURE) ×1 IMPLANT
SUT MNCRL 4-0 (SUTURE) ×1
SUT MNCRL 4-0 27XMFL (SUTURE) ×1
SUT VIC AB 3-0 SH 27 (SUTURE) ×1
SUT VIC AB 3-0 SH 27X BRD (SUTURE) ×1 IMPLANT
SUTURE MNCRL 4-0 27XMF (SUTURE) ×1 IMPLANT
SYR 10ML LL (SYRINGE) ×1 IMPLANT
TRAP FLUID SMOKE EVACUATOR (MISCELLANEOUS) ×1 IMPLANT
WATER STERILE IRR 500ML POUR (IV SOLUTION) ×1 IMPLANT

## 2022-09-29 NOTE — Discharge Instructions (Signed)

## 2022-09-29 NOTE — Anesthesia Procedure Notes (Signed)
Procedure Name: Intubation Date/Time: 09/29/2022 7:44 AM  Performed by: Danelle Berry, CRNAPre-anesthesia Checklist: Patient identified, Emergency Drugs available, Suction available, Patient being monitored and Timeout performed Patient Re-evaluated:Patient Re-evaluated prior to induction Oxygen Delivery Method: Circle system utilized and Simple face mask Preoxygenation: Pre-oxygenation with 100% oxygen Induction Type: IV induction Ventilation: Mask ventilation without difficulty Laryngoscope Size: McGraph and 3 Grade View: Grade I Tube type: Oral Tube size: 7.0 mm Number of attempts: 1 Airway Equipment and Method: Stylet Secured at: 20 cm Tube secured with: Tape Dental Injury: Teeth and Oropharynx as per pre-operative assessment

## 2022-09-29 NOTE — Transfer of Care (Signed)
Immediate Anesthesia Transfer of Care Note  Patient: Kathy Pham  Procedure(s) Performed: HERNIA REPAIR UMBILICAL ADULT open fascial defect  Patient Location: PACU  Anesthesia Type:General  Level of Consciousness: awake and alert   Airway & Oxygen Therapy: Patient Spontanous Breathing and Patient connected to face mask  Post-op Assessment: Report given to RN and Post -op Vital signs reviewed and stable  Post vital signs: Reviewed and stable  Last Vitals:  Vitals Value Taken Time  BP 123/78 09/29/22 0832  Temp    Pulse 102 09/29/22 0835  Resp 16 09/29/22 0835  SpO2 100 % 09/29/22 0835  Vitals shown include unvalidated device data.  Last Pain:  Vitals:   09/29/22 0624  TempSrc: Temporal  PainSc: 0-No pain         Complications: No notable events documented.

## 2022-09-29 NOTE — Anesthesia Preprocedure Evaluation (Signed)
Anesthesia Evaluation  Patient identified by MRN, date of birth, ID band Patient awake    Reviewed: Allergy & Precautions, H&P , NPO status , Patient's Chart, lab work & pertinent test results, reviewed documented beta blocker date and time   History of Anesthesia Complications Negative for: history of anesthetic complications  Airway Mallampati: II  TM Distance: >3 FB Neck ROM: full    Dental  (+) Dental Advidsory Given, Teeth Intact   Pulmonary neg pulmonary ROS, former smoker   Pulmonary exam normal breath sounds clear to auscultation       Cardiovascular Exercise Tolerance: Good negative cardio ROS Normal cardiovascular exam Rhythm:regular Rate:Normal     Neuro/Psych negative neurological ROS  negative psych ROS   GI/Hepatic negative GI ROS, Neg liver ROS,,,  Endo/Other  negative endocrine ROS    Renal/GU negative Renal ROS  negative genitourinary   Musculoskeletal   Abdominal   Peds  Hematology negative hematology ROS (+)   Anesthesia Other Findings Past Medical History: No date: Abdominal pain, recurrent No date: Anxiety No date: Dysmenorrhea No date: Headache(784.0) No date: History of methicillin resistant staphylococcus aureus (MRSA) No date: Menorrhagia No date: Nausea No date: Vomiting   Reproductive/Obstetrics negative OB ROS                             Anesthesia Physical Anesthesia Plan  ASA: 2  Anesthesia Plan: General   Post-op Pain Management:    Induction: Intravenous  PONV Risk Score and Plan: 3 and Ondansetron, Dexamethasone, Midazolam and Treatment may vary due to age or medical condition  Airway Management Planned: Oral ETT  Additional Equipment:   Intra-op Plan:   Post-operative Plan: Extubation in OR  Informed Consent: I have reviewed the patients History and Physical, chart, labs and discussed the procedure including the risks, benefits and  alternatives for the proposed anesthesia with the patient or authorized representative who has indicated his/her understanding and acceptance.     Dental Advisory Given  Plan Discussed with: Anesthesiologist, CRNA and Surgeon  Anesthesia Plan Comments:        Anesthesia Quick Evaluation

## 2022-09-29 NOTE — Op Note (Signed)
Umbilical Hernia Repair  Pre-operative Diagnosis: Umbilical hernia  Post-operative Diagnosis: same  Surgeon: Campbell Lerner, MD FACS  Anesthesia: General   Findings: 1 cm fascial defect diameter    Estimated Blood Loss: 2 mL                 Specimens: None.         Complications: none              Procedure Details  The patient was seen again in the Holding Room. The benefits, complications, treatment options, and expected outcomes were discussed with the patient. The risks of bleeding, infection, recurrence of symptoms, failure to resolve symptoms, bowel injury, mesh placement, mesh infection, any of which could require further surgery were reviewed with the patient. The likelihood of improving the patient's symptoms with return to their baseline status is good.  The patient and/or family concurred with the proposed plan, giving informed consent.  The patient was taken to Operating Room, identified, and the procedure verified.  A Time Out was held and the above information confirmed.  Prior to the induction of general anesthesia, antibiotic prophylaxis was administered. VTE prophylaxis was in place. General endotracheal anesthesia was then administered and tolerated well. After the induction, the abdomen was prepped with Chloraprep and draped in the sterile fashion. The patient was positioned in the supine position.  Incision was created with a scalpel over the hernia defect. Electrocautery was used to dissect through subcutaneous tissue, the preperitoneal adipose/hernia sac was reduced and the fascial defect defined. The hernia was measured. 1 cm.   I closed the hernia defect with interrupted 0 Ethibond sutures.   Incision was closed in a 2 layer fashion with 3-0 Vicryl and 4-0 Monocryl. Dermabond was used to coat the skin. Marcaine quarter percent with epinephrine and lidocaine 1% was used to inject all the incision site. Patient tolerated procedure well and there were no immediate  complications. Needle and laparotomy counts were correct   Campbell Lerner, M.D., [facility]  09/29/2022 ; 8:32 AM

## 2022-09-29 NOTE — Interval H&P Note (Signed)
History and Physical Interval Note:  09/29/2022 7:22 AM  Kathy Pham  has presented today for surgery, with the diagnosis of umbilical hernia.  The various methods of treatment have been discussed with the patient and family. After consideration of risks, benefits and other options for treatment, the patient has consented to  Procedure(s): HERNIA REPAIR UMBILICAL ADULT open fascial defect (N/A) as a surgical intervention.  The patient's history has been reviewed, patient examined, no change in status, stable for surgery.  I have reviewed the patient's chart and labs.  Questions were answered to the patient's satisfaction.     Campbell Lerner

## 2022-09-29 NOTE — Progress Notes (Signed)
Medicated for nausea with Phenergan 6.25 mg, with relief noted by 0945.

## 2022-09-29 NOTE — Progress Notes (Signed)
[facility] on [address] not filling pain meds.

## 2022-09-30 NOTE — Anesthesia Postprocedure Evaluation (Signed)
Anesthesia Post Note  Patient: Kathy Pham  Procedure(s) Performed: HERNIA REPAIR UMBILICAL ADULT open fascial defect  Patient location during evaluation: PACU Anesthesia Type: General Level of consciousness: awake and alert Pain management: pain level controlled Vital Signs Assessment: post-procedure vital signs reviewed and stable Respiratory status: spontaneous breathing, nonlabored ventilation, respiratory function stable and patient connected to nasal cannula oxygen Cardiovascular status: blood pressure returned to baseline and stable Postop Assessment: no apparent nausea or vomiting Anesthetic complications: no   No notable events documented.   Last Vitals:  Vitals:   09/29/22 0900 09/29/22 0912  BP: 119/81 116/84  Pulse: 79 80  Resp: 18 20  Temp: 36.4 C 36.6 C  SpO2: 97%     Last Pain:  Vitals:   09/29/22 0912  TempSrc: Temporal  PainSc:                  Lenard Simmer

## 2022-10-12 ENCOUNTER — Encounter: Payer: Managed Care, Other (non HMO) | Admitting: Physician Assistant

## 2022-11-02 ENCOUNTER — Encounter: Payer: Self-pay | Admitting: Physician Assistant

## 2022-11-02 ENCOUNTER — Ambulatory Visit (INDEPENDENT_AMBULATORY_CARE_PROVIDER_SITE_OTHER): Payer: Managed Care, Other (non HMO) | Admitting: Physician Assistant

## 2022-11-02 VITALS — BP 122/81 | HR 106 | Temp 98.5°F | Ht 63.5 in | Wt 198.0 lb

## 2022-11-02 DIAGNOSIS — Z09 Encounter for follow-up examination after completed treatment for conditions other than malignant neoplasm: Secondary | ICD-10-CM

## 2022-11-02 DIAGNOSIS — K429 Umbilical hernia without obstruction or gangrene: Secondary | ICD-10-CM | POA: Diagnosis not present

## 2022-11-02 NOTE — Patient Instructions (Signed)

## 2022-11-02 NOTE — Progress Notes (Signed)
[facility] POST-OP OFFICE VISIT  11/02/2022  HPI: Kathy Pham is a 27 y.o. female ~1 month s/p umbilical hernia repair  She is doing very well Soreness for about 3-4 days and then improved No without pain No fever, chills, nausea, emesis, or bowel changes Incision is well healed No other complaints   Vital signs: BP 122/81   Pulse (!) 106   Temp 98.5 F (36.9 C) (Oral)   Ht 5' 3.5" (1.613 m)   Wt 198 lb (89.8 kg)   SpO2 97%   BMI 34.52 kg/m    Physical Exam: Constitutional: Well appearing female, NAD Abdomen: Soft, non-tender, non-distended, no rebound/guarding Skin: Umbilical incision is well healed, no erythema or drainage   Assessment/Plan: This is a 27 y.o. female ~1 month s/p umbilical hernia repair   - Pain control prn  - Reviewed wound care recommendation  - Reviewed lifting restrictions; 6 weeks total (1-2 weeks more)  - She can follow up on as needed basis; She understands to call with questions/concerns  -- Lynden Oxford, PA-C [facility] 11/02/2022, 2:33 PM M-F: 7am - 4pm

## 2023-04-14 IMAGING — US US OB COMP LESS 14 WK
1 series · 14 of 28 positions shown · non-contrast
Comparison: None.

CLINICAL DATA: Initial evaluation for early pregnancy, unknown
dating.

EXAM:
OBSTETRIC <14 WK ULTRASOUND
TECHNIQUE: Transabdominal ultrasound was performed for evaluation of the
gestation as well as the maternal uterus and adnexal regions.

[Series 1: us ob less than 14 weeks with ob transvaginal · 47 acquisitions, 14 frames shown]
[im 2/47]
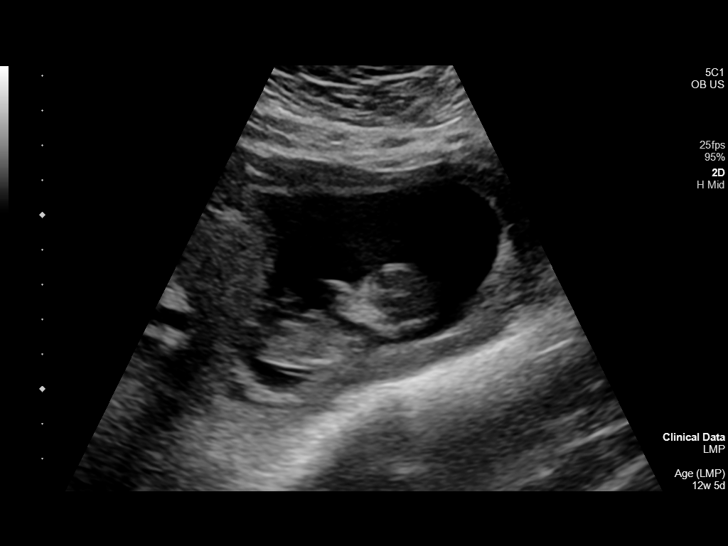
[im 6/47]
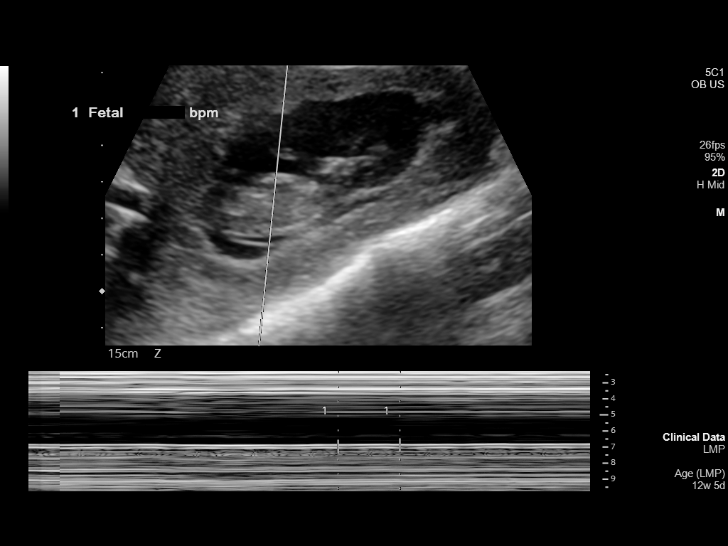
[im 9/47]
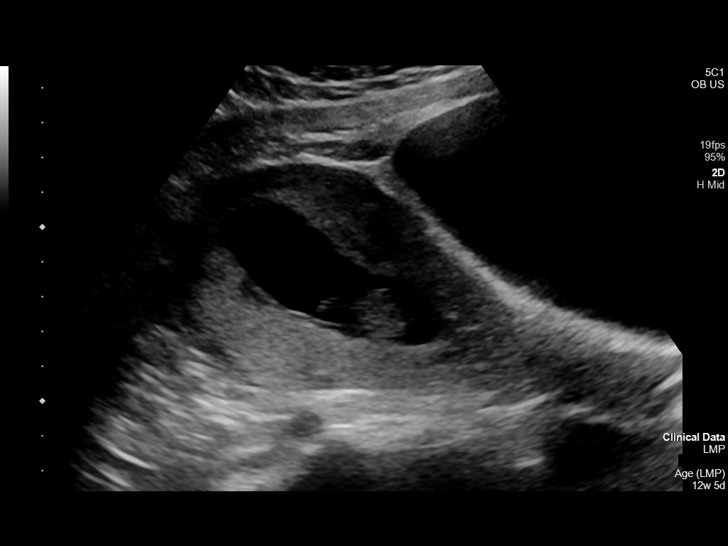
[im 12/47]
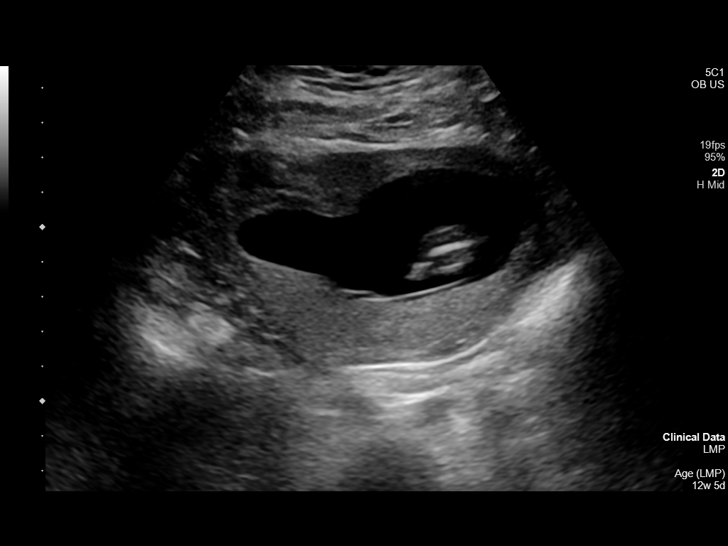
[im 16/47]
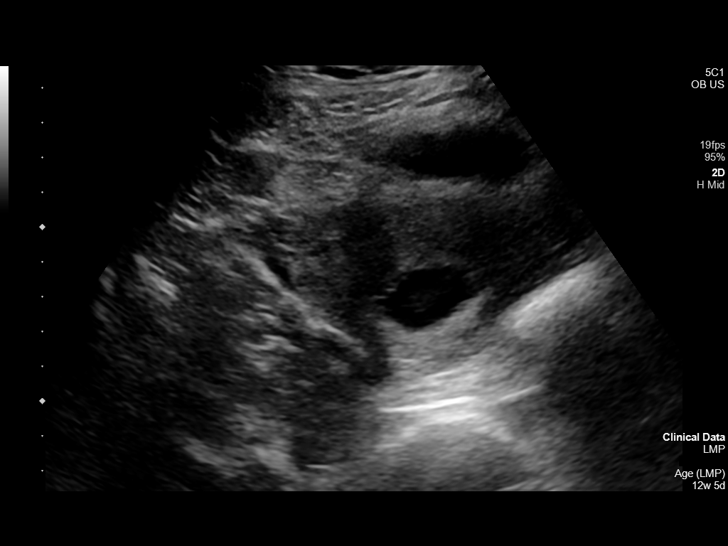
[im 19/47]
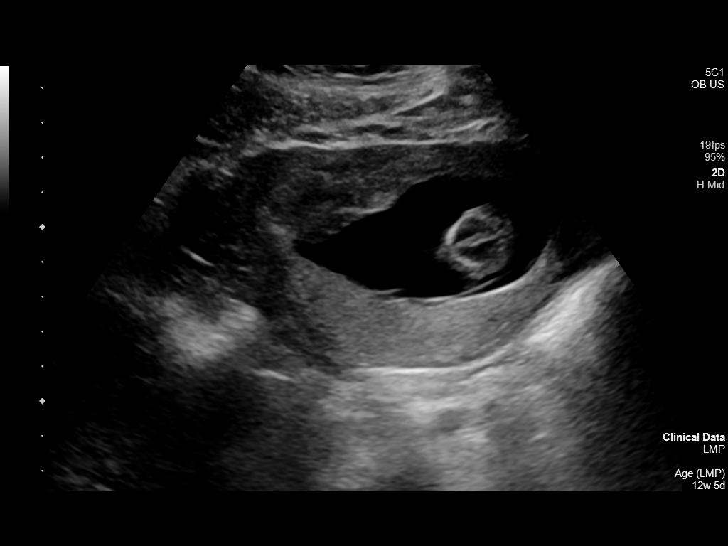
[im 23/47]
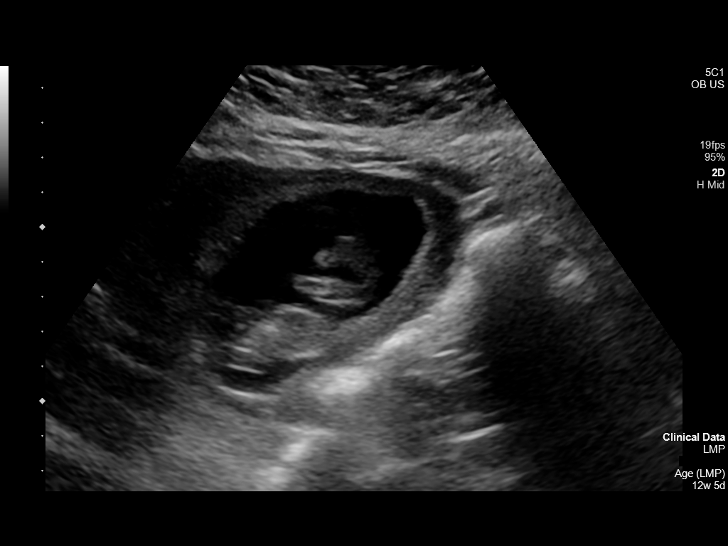
[im 26/47]
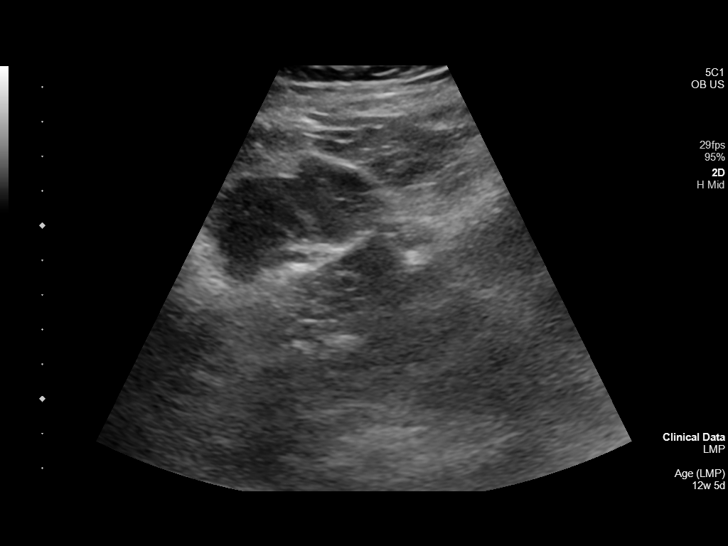
[im 29/47]
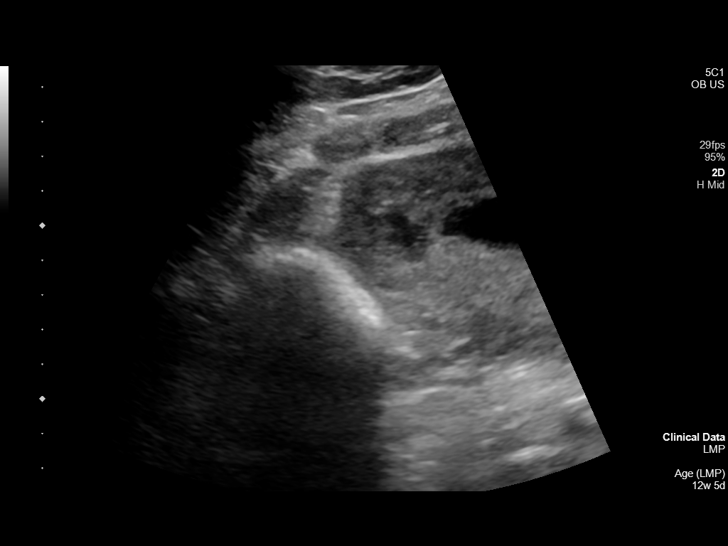
[im 33/47]
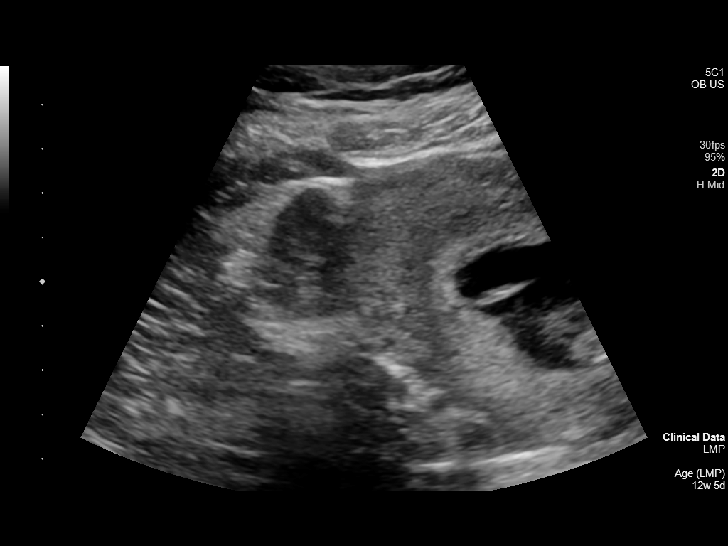
[im 36/47]
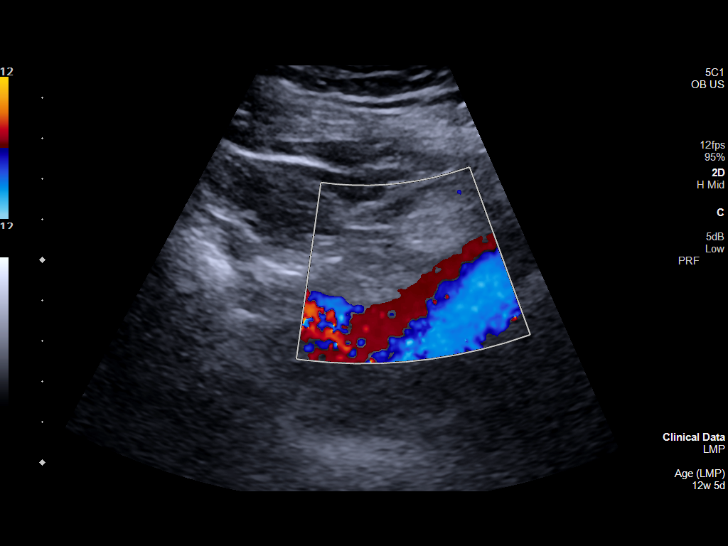
[im 40/47]
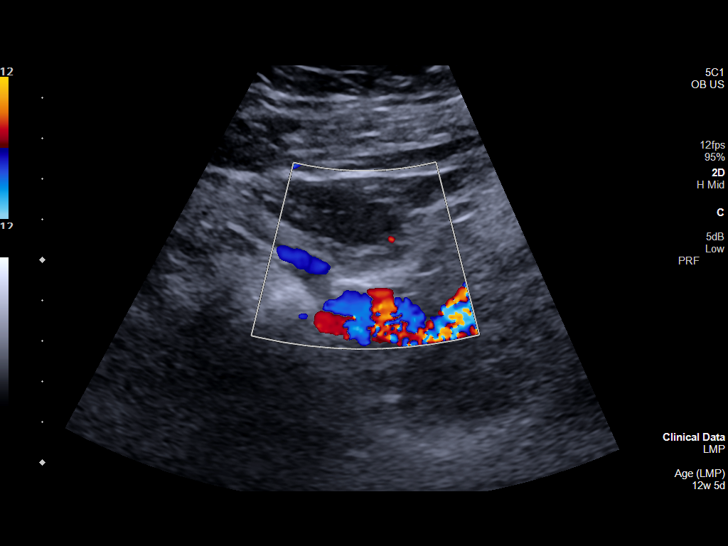
[im 43/47]
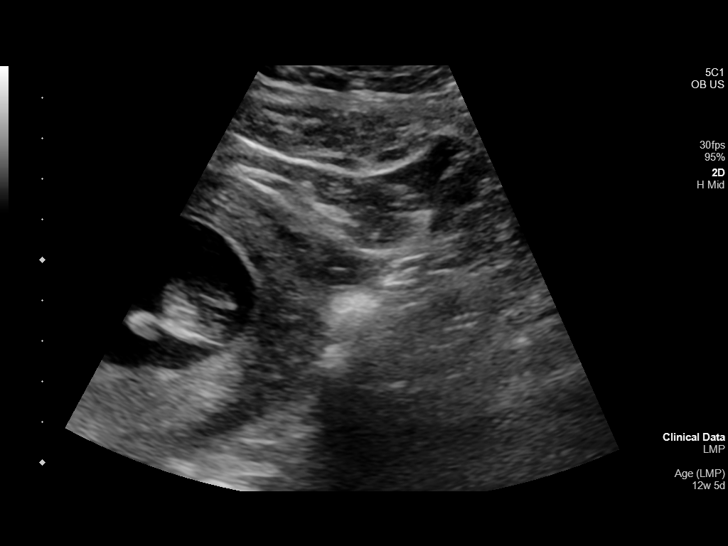
[im 47/47]
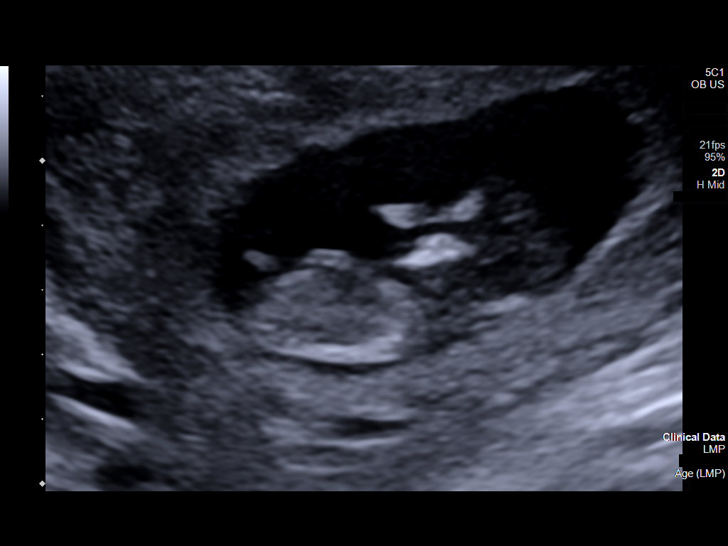

[14 of 28 positions shown; findings below may reference images not displayed]

FINDINGS: Intrauterine gestational sac: Single

Yolk sac:  Present

Embryo:  Present

Cardiac Activity: Present

Heart Rate: 166 bpm

CRL: 56.0 mm   12 w 1 d                  US EDC: 05/10/2021

Subchorionic hemorrhage:  None visualized.

Maternal uterus/adnexae: Ovaries within normal limits bilaterally.
No adnexal mass or free fluid.

Incidental note made of a subserosal fibroid at the anterior uterine
fundus measuring 1.6 x 1.4 x 0.9 cm.
IMPRESSION: 1. Single viable intrauterine pregnancy as above, estimated
gestational age 12 weeks and 1 day by crown-rump length, with
ultrasound EDC of 05/10/2021. No complication.
2. 1.6 cm subserosal fibroid at the anterior uterine fundus.
3. No other acute maternal uterine or adnexal abnormality.

## 2023-06-03 IMAGING — US US OB COMP +14 WK
1 of 2 series · 13 of 28 positions shown · non-contrast
Comparison: none

CLINICAL DATA: Pregnancy.  Assess fetal anatomy.

EXAM:
OBSTETRICAL ULTRASOUND >14 WKS

[Series 1: us ob comp + 14 wk · 13 of 65 slices shown]
[im 3/65]
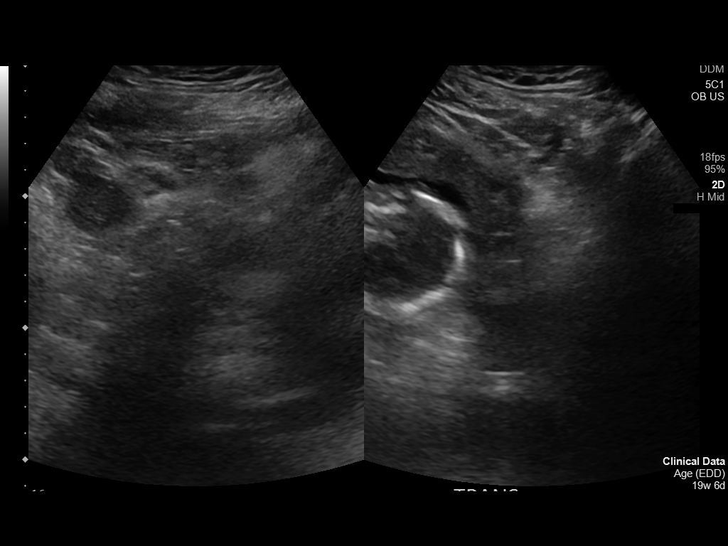
[im 8/65]
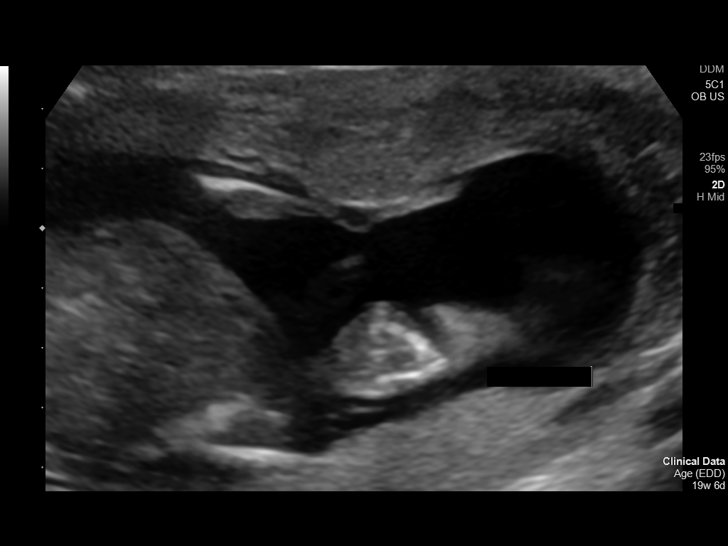
[im 13/65]
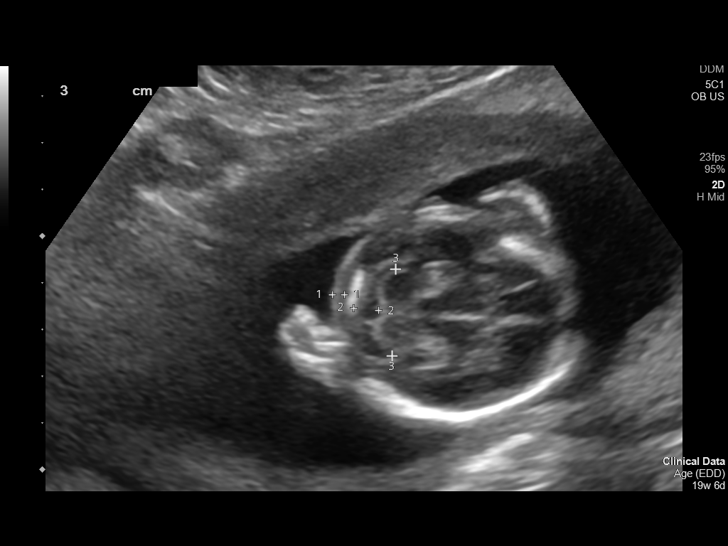
[im 18/65]
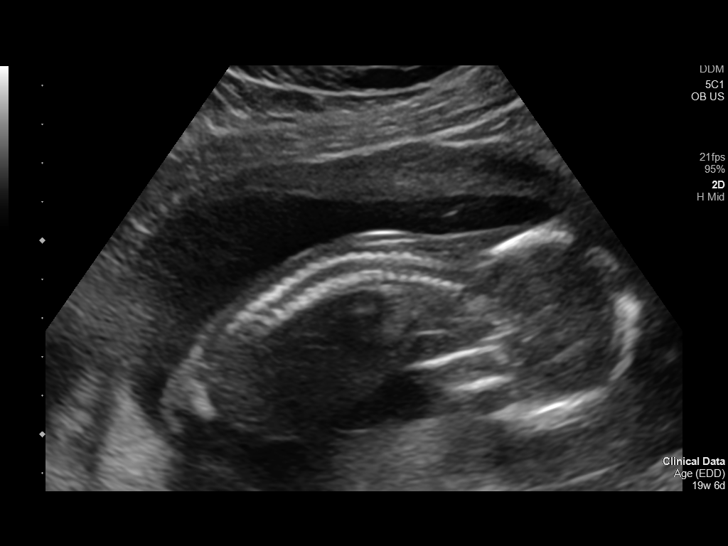
[im 23/65]
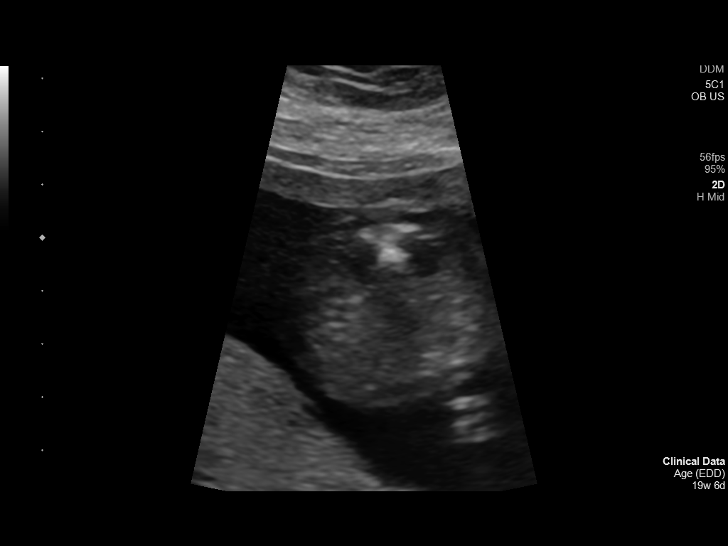
[im 28/65]
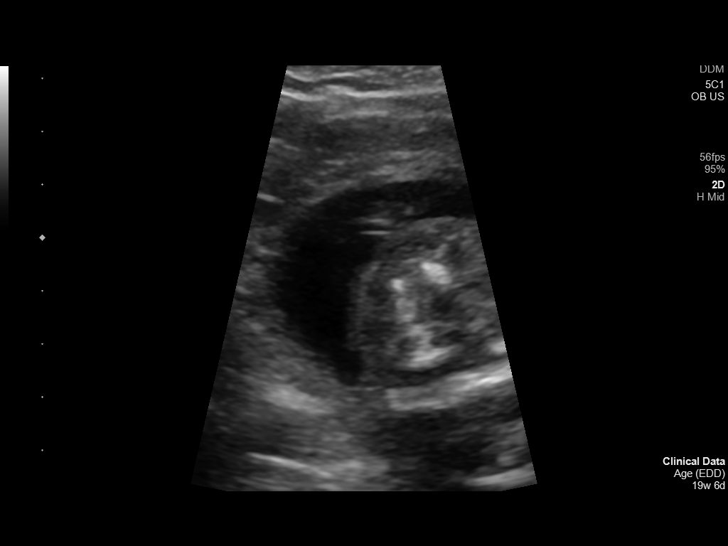
[im 35/65]
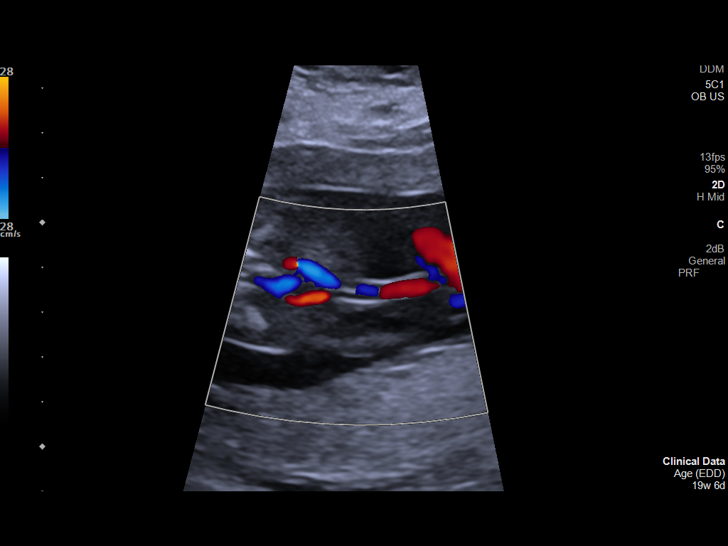
[im 40/65]
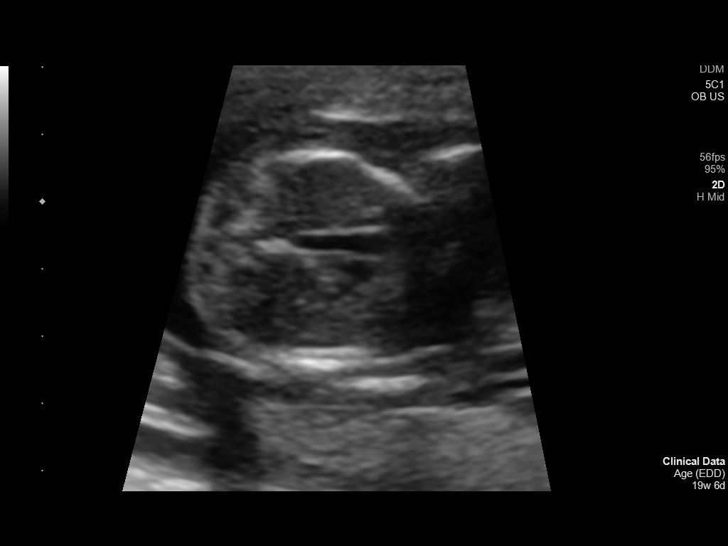
[im 45/65]
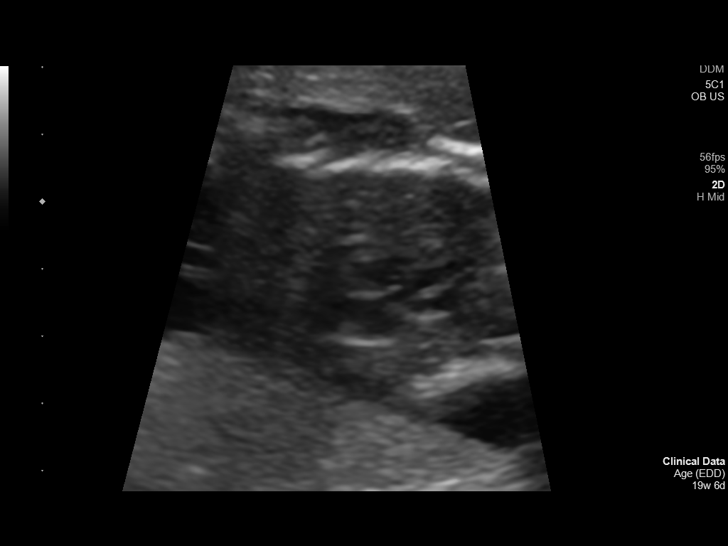
[im 50/65]
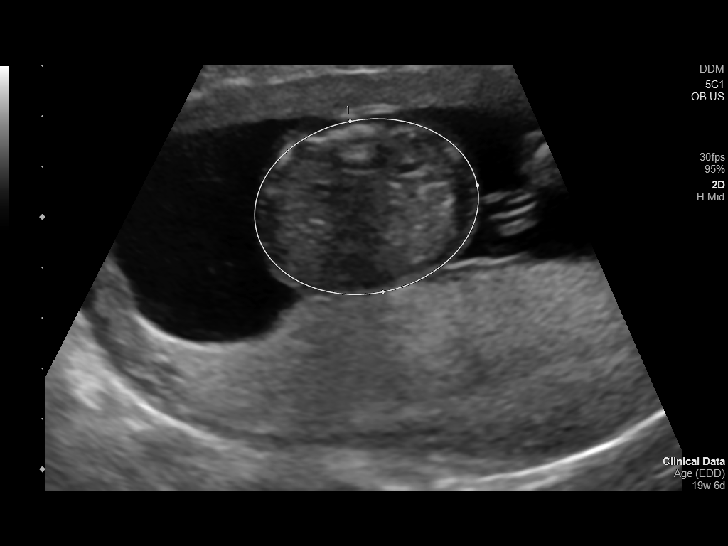
[im 55/65]
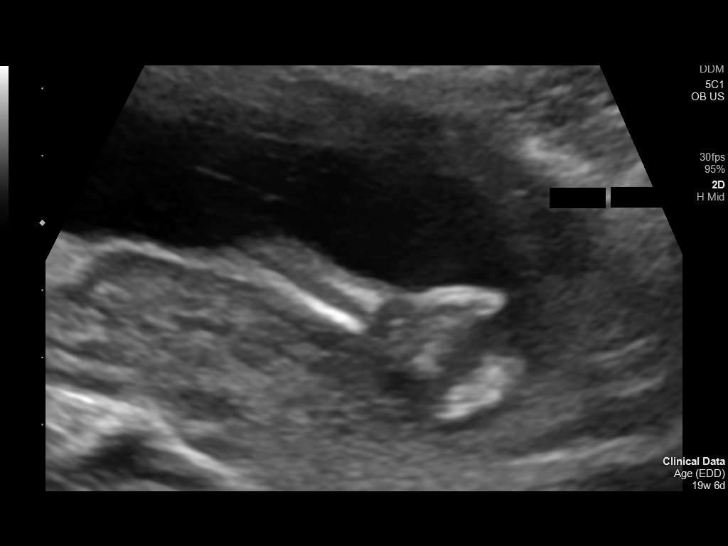
[im 60/65]
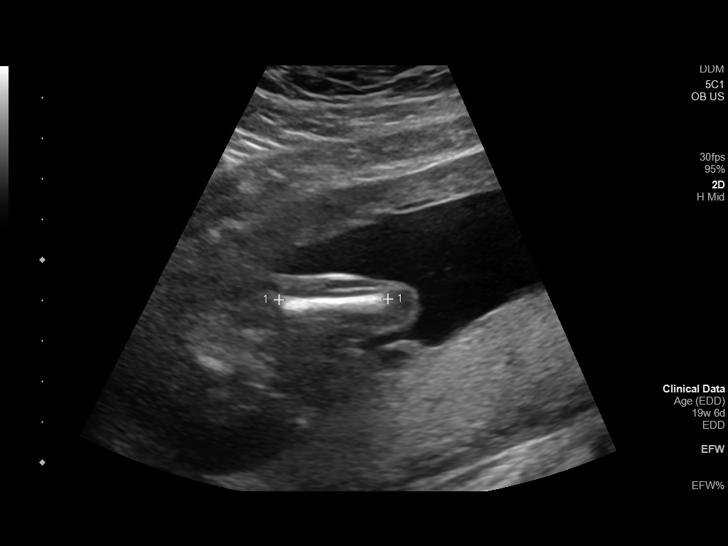
[im 65/65]
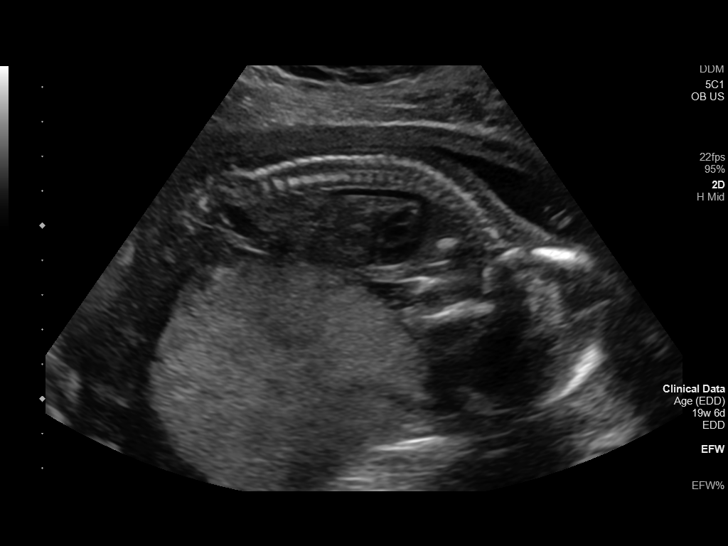

[13 of 28 positions shown; findings below may reference images not displayed]

FINDINGS: Number of Fetuses: 1

Heart Rate:  164 bpm

Movement: Yes

Presentation: Cephalic

Previa: No

Placental Location: Posterior

Amniotic Fluid (Subjective): Normal

Amniotic Fluid (Objective):

Vertical pocket = 3.4cm

FETAL BIOMETRY

BPD: 4.3cm 19w 0d

HC:   15.9cm 18w 5d

AC:   13.3cm 18w 6d

FL:   3.1cm 19w 3d

Current Mean GA: 19w 0d US EDC: 05/12/2021

Assigned GA:  19w 6d Assigned EDC: 05/06/2021

Estimated Fetal Weight:  272g 11%ile

FETAL ANATOMY

Lateral Ventricles: Appears normal

Thalami/CSP: Appears normal

Posterior Fossa:  Appears normal

Nuchal Region: Appears normal   NFT= 3 mm

Upper Lip: Appears normal

Spine: Appears normal

4 Chamber Heart on Left: Appears normal

LVOT: Appears normal

RVOT: Appears normal

Stomach on Left: Appears normal

3 Vessel Cord: Appears normal

Cord Insertion site: Appears normal

Kidneys: Appears normal

Bladder: Appears normal

Extremities: Appears normal

Sex: Male

Technically difficult due to: None

Maternal Findings:

Cervix:  Closed.  4.7 cm
IMPRESSION: 1. Single live intrauterine gestation in cephalic presentation.
Assigned gestational age of 19 weeks 6 days.
2. Adequate interval growth.
3. Complete fetal anatomic survey.  No fetal anomalies detected.
